# Patient Record
Sex: Female | Born: 1940 | Race: White | Hispanic: No | Smoking: Never smoker
Health system: Southern US, Community
[De-identification: ages and names within clinical notes are randomized; demographics above are authoritative.]

## PROBLEM LIST (undated history)

## (undated) DIAGNOSIS — H269 Unspecified cataract: Secondary | ICD-10-CM

## (undated) DIAGNOSIS — M810 Age-related osteoporosis without current pathological fracture: Secondary | ICD-10-CM

## (undated) DIAGNOSIS — Z9889 Other specified postprocedural states: Secondary | ICD-10-CM

## (undated) DIAGNOSIS — R112 Nausea with vomiting, unspecified: Secondary | ICD-10-CM

## (undated) DIAGNOSIS — G43009 Migraine without aura, not intractable, without status migrainosus: Secondary | ICD-10-CM

## (undated) DIAGNOSIS — E785 Hyperlipidemia, unspecified: Secondary | ICD-10-CM

## (undated) DIAGNOSIS — I10 Essential (primary) hypertension: Secondary | ICD-10-CM

## (undated) DIAGNOSIS — H3530 Unspecified macular degeneration: Secondary | ICD-10-CM

## (undated) DIAGNOSIS — N189 Chronic kidney disease, unspecified: Secondary | ICD-10-CM

## (undated) HISTORY — DX: Migraine without aura, not intractable, without status migrainosus: G43.009

## (undated) HISTORY — DX: Essential (primary) hypertension: I10

## (undated) HISTORY — DX: Hyperlipidemia, unspecified: E78.5

## (undated) HISTORY — DX: Chronic kidney disease, unspecified: N18.9

## (undated) HISTORY — PX: CATARACT EXTRACTION, BILATERAL: SHX1313

## (undated) HISTORY — DX: Unspecified cataract: H26.9

## (undated) HISTORY — PX: COLONOSCOPY: SHX174

---

## 1961-10-09 HISTORY — PX: NEPHRECTOMY: SHX65

## 1973-10-09 HISTORY — PX: TUBAL LIGATION: SHX77

## 2000-07-12 ENCOUNTER — Other Ambulatory Visit: Admission: RE | Admit: 2000-07-12 | Discharge: 2000-07-12 | Payer: Self-pay | Admitting: Obstetrics and Gynecology

## 2001-07-18 ENCOUNTER — Other Ambulatory Visit: Admission: RE | Admit: 2001-07-18 | Discharge: 2001-07-18 | Payer: Self-pay | Admitting: Obstetrics and Gynecology

## 2003-01-31 ENCOUNTER — Encounter: Payer: Self-pay | Admitting: *Deleted

## 2003-01-31 ENCOUNTER — Emergency Department (HOSPITAL_COMMUNITY): Admission: EM | Admit: 2003-01-31 | Discharge: 2003-01-31 | Payer: Self-pay | Admitting: Emergency Medicine

## 2006-03-26 ENCOUNTER — Ambulatory Visit: Payer: Self-pay | Admitting: Internal Medicine

## 2006-04-06 ENCOUNTER — Ambulatory Visit: Payer: Self-pay | Admitting: Internal Medicine

## 2006-04-06 ENCOUNTER — Encounter: Payer: Self-pay | Admitting: Internal Medicine

## 2008-11-05 ENCOUNTER — Ambulatory Visit: Payer: Self-pay | Admitting: Surgery

## 2011-02-21 NOTE — Procedures (Signed)
DUPLEX DEEP VENOUS EXAM - LOWER EXTREMITY   INDICATION:  Right lower extremity pain.   HISTORY:  Edema:  No.  Trauma/Surgery:  No.  Pain:  Right lower extremity popliteal fossa/calf, started Tuesday after  workout at gym.  PE:  No.  Previous DVT:  Left calf SVT approximately 7 years ago.  Anticoagulants:  Baby aspirin.  Other:   DUPLEX EXAM:                CFV   SFV   PopV  PTV    GSV                R  L  R  L  R  L  R   L  R  L  Thrombosis    o  o  o     o     o      o  Spontaneous   +  +  +     +     +      +  Phasic        +  +  +     +     +      +  Augmentation  +  +  +     +     +      +  Compressible  +  +  +     +     +      +  Competent   Legend:  + - yes  o - no  p - partial  D - decreased   IMPRESSION:  No evidence of DVT or SVT in the right lower extremity or  the left common femoral vein.    _____________________________  V. Charlena Cross, MD   AS/MEDQ  D:  11/05/2008  T:  11/05/2008  Job:  [id number]

## 2011-05-13 ENCOUNTER — Encounter: Payer: Self-pay | Admitting: Internal Medicine

## 2011-06-20 ENCOUNTER — Encounter: Payer: Self-pay | Admitting: *Deleted

## 2011-07-24 ENCOUNTER — Ambulatory Visit (AMBULATORY_SURGERY_CENTER): Payer: Federal, State, Local not specified - PPO

## 2011-07-24 ENCOUNTER — Encounter: Payer: Self-pay | Admitting: Internal Medicine

## 2011-07-24 VITALS — Ht 61.5 in | Wt 150.3 lb

## 2011-07-24 DIAGNOSIS — Z1211 Encounter for screening for malignant neoplasm of colon: Secondary | ICD-10-CM

## 2011-07-24 MED ORDER — PEG-KCL-NACL-NASULF-NA ASC-C 100 G PO SOLR: 1.0000 | Freq: Once | ORAL | Status: DC

## 2011-08-04 ENCOUNTER — Encounter: Payer: Self-pay | Admitting: Internal Medicine

## 2011-08-04 ENCOUNTER — Ambulatory Visit (AMBULATORY_SURGERY_CENTER): Payer: Federal, State, Local not specified - PPO | Admitting: Internal Medicine

## 2011-08-04 VITALS — BP 128/77 | HR 69 | Temp 97.6°F | Resp 19 | Ht 61.5 in | Wt 150.0 lb

## 2011-08-04 DIAGNOSIS — Z86010 Personal history of colonic polyps: Secondary | ICD-10-CM

## 2011-08-04 DIAGNOSIS — D126 Benign neoplasm of colon, unspecified: Secondary | ICD-10-CM

## 2011-08-04 DIAGNOSIS — Z1211 Encounter for screening for malignant neoplasm of colon: Secondary | ICD-10-CM

## 2011-08-04 MED ORDER — SODIUM CHLORIDE 0.9 % IV SOLN: 500.0000 mL | INTRAVENOUS | Status: DC

## 2011-08-04 NOTE — Patient Instructions (Signed)
Please read the handouts given to you by your recovery room nurse about polyps.   Resume your routine medications today.  Call us if you have any questions at [phone].  Thank-you for choosing [facility] for your medical needs today.

## 2011-08-07 ENCOUNTER — Telehealth: Payer: Self-pay | Admitting: *Deleted

## 2011-08-07 NOTE — Telephone Encounter (Signed)
Left message

## 2014-05-11 ENCOUNTER — Ambulatory Visit (HOSPITAL_COMMUNITY)
Admission: RE | Admit: 2014-05-11 | Discharge: 2014-05-11 | Disposition: A | Discharge status: Home / Self Care | Payer: Federal, State, Local not specified - PPO | Source: Ambulatory Visit | Attending: Internal Medicine | Admitting: Internal Medicine

## 2014-05-11 ENCOUNTER — Other Ambulatory Visit (HOSPITAL_COMMUNITY): Payer: Self-pay | Admitting: Internal Medicine

## 2014-05-11 ENCOUNTER — Encounter (HOSPITAL_COMMUNITY): Payer: Self-pay

## 2014-05-11 DIAGNOSIS — M810 Age-related osteoporosis without current pathological fracture: Secondary | ICD-10-CM | POA: Insufficient documentation

## 2014-05-11 HISTORY — DX: Age-related osteoporosis without current pathological fracture: M81.0

## 2014-05-11 MED ORDER — ZOLEDRONIC ACID 5 MG/100ML IV SOLN
5.0000 mg | Freq: Once | INTRAVENOUS | Status: AC
  Administered 2014-05-11: 5 mg via INTRAVENOUS
  Filled 2014-05-11: qty 100

## 2014-05-11 MED ORDER — SODIUM CHLORIDE 0.9 % IV SOLN
INTRAVENOUS | Status: DC
  Administered 2014-05-11: 250 mL via INTRAVENOUS

## 2014-05-11 NOTE — Discharge Instructions (Signed)
RECLAST °Zoledronic Acid injection (Paget's Disease, Osteoporosis) °What is this medicine? °ZOLEDRONIC ACID (ZOE le dron ik AS id) lowers the amount of calcium loss from bone. It is used to treat Paget's disease and osteoporosis in women. °This medicine may be used for other purposes; ask your health care provider or pharmacist if you have questions. °COMMON BRAND NAME(S): Reclast, Zometa °What should I tell my health care provider before I take this medicine? °They need to know if you have any of these conditions: °-aspirin-sensitive asthma °-cancer, especially if you are receiving medicines used to treat cancer °-dental disease or wear dentures °-infection °-kidney disease °-low levels of calcium in the blood °-past surgery on the parathyroid gland or intestines °-receiving corticosteroids like dexamethasone or prednisone °-an unusual or allergic reaction to zoledronic acid, other medicines, foods, dyes, or preservatives °-pregnant or trying to get pregnant °-breast-feeding °How should I use this medicine? °This medicine is for infusion into a vein. It is given by a health care professional in a hospital or clinic setting. °Talk to your pediatrician regarding the use of this medicine in children. This medicine is not approved for use in children. °Overdosage: If you think you have taken too much of this medicine contact a poison control center or emergency room at once. °NOTE: This medicine is only for you. Do not share this medicine with others. °What if I miss a dose? °It is important not to miss your dose. Call your doctor or health care professional if you are unable to keep an appointment. °What may interact with this medicine? °-certain antibiotics given by injection °-NSAIDs, medicines for pain and inflammation, like ibuprofen or naproxen °-some diuretics like bumetanide, furosemide °-teriparatide °This list may not describe all possible interactions. Give your health care provider a list of all the  medicines, herbs, non-prescription drugs, or dietary supplements you use. Also tell them if you smoke, drink alcohol, or use illegal drugs. Some items may interact with your medicine. °What should I watch for while using this medicine? °Visit your doctor or health care professional for regular checkups. It may be some time before you see the benefit from this medicine. Do not stop taking your medicine unless your doctor tells you to. Your doctor may order blood tests or other tests to see how you are doing. °Women should inform their doctor if they wish to become pregnant or think they might be pregnant. There is a potential for serious side effects to an unborn child. Talk to your health care professional or pharmacist for more information. °You should make sure that you get enough calcium and vitamin D while you are taking this medicine. Discuss the foods you eat and the vitamins you take with your health care professional. °Some people who take this medicine have severe bone, joint, and/or muscle pain. This medicine may also increase your risk for jaw problems or a broken thigh bone. Tell your doctor right away if you have severe pain in your jaw, bones, joints, or muscles. Tell your doctor if you have any pain that does not go away or that gets worse. °Tell your dentist and dental surgeon that you are taking this medicine. You should not have major dental surgery while on this medicine. See your dentist to have a dental exam and fix any dental problems before starting this medicine. Take good care of your teeth while on this medicine. Make sure you see your dentist for regular follow-up appointments. °What side effects may I notice from receiving this   medicine? °Side effects that you should report to your doctor or health care professional as soon as possible: °-allergic reactions like skin rash, itching or hives, swelling of the face, lips, or tongue °-anxiety, confusion, or depression °-breathing  problems °-changes in vision °-eye pain °-feeling faint or lightheaded, falls °-jaw pain, especially after dental work °-mouth sores °-muscle cramps, stiffness, or weakness °-trouble passing urine or change in the amount of urine °Side effects that usually do not require medical attention (report to your doctor or health care professional if they continue or are bothersome): °-bone, joint, or muscle pain °-constipation °-diarrhea °-fever °-hair loss °-irritation at site where injected °-loss of appetite °-nausea, vomiting °-stomach upset °-trouble sleeping °-trouble swallowing °-weak or tired °This list may not describe all possible side effects. Call your doctor for medical advice about side effects. You may report side effects to FDA at 1-800-FDA-1088. °Where should I keep my medicine? °This drug is given in a hospital or clinic and will not be stored at home. °NOTE: This sheet is a summary. It may not cover all possible information. If you have questions about this medicine, talk to your doctor, pharmacist, or health care provider. °© 2015, [employer]. (2013-03-10 10:03:48) °Osteoporosis °Throughout your life, your body breaks down old bone and replaces it with new bone. As you get older, your body does not replace bone as quickly as it breaks it down. By the age of 30 years, most people begin to gradually lose bone because of the imbalance between bone loss and replacement. Some people lose more bone than others. Bone loss beyond a specified normal degree is considered osteoporosis.  °Osteoporosis affects the strength and durability of your bones. The inside of the ends of your bones and your flat bones, like the bones of your pelvis, look like honeycomb, filled with tiny open spaces. As bone loss occurs, your bones become less dense. This means that the open spaces inside your bones become bigger and the walls between these spaces become thinner. This makes your bones weaker. Bones of a person with  osteoporosis can become so weak that they can break (fracture) during minor accidents, such as a simple fall. °CAUSES  °The following factors have been associated with the development of osteoporosis: °· Smoking. °· Drinking more than 2 alcoholic drinks several days per week. °· Long-term use of certain medicines: °¨ Corticosteroids. °¨ Chemotherapy medicines. °¨ Thyroid medicines. °¨ Antiepileptic medicines. °¨ Gonadal hormone suppression medicine. °¨ Immunosuppression medicine. °· Being underweight. °· Lack of physical activity. °· Lack of exposure to the sun. This can lead to vitamin D deficiency. °· Certain medical conditions: °¨ Certain inflammatory bowel diseases, such as Crohn disease and ulcerative colitis. °¨ Diabetes. °¨ Hyperthyroidism. °¨ Hyperparathyroidism. °RISK FACTORS °Anyone can develop osteoporosis. However, the following factors can increase your risk of developing osteoporosis: °· Gender--Women are at higher risk than men. °· Age--Being older than 50 years increases your risk. °· Ethnicity--White and Asian people have an increased risk. °· Weight --Being extremely underweight can increase your risk of osteoporosis. °· Family history of osteoporosis--Having a family member who has developed osteoporosis can increase your risk. °SYMPTOMS  °Usually, people with osteoporosis have no symptoms.  °DIAGNOSIS  °Signs during a physical exam that may prompt your caregiver to suspect osteoporosis include: °· Decreased height. This is usually caused by the compression of the bones that form your spine (vertebrae) because they have weakened and become fractured. °· A curving or rounding of the upper back (kyphosis). °  To confirm signs of osteoporosis, your caregiver may request a procedure that uses 2 low-dose X-ray beams with different levels of energy to measure your bone mineral density (dual-energy X-ray absorptiometry [DXA]). Also, your caregiver may check your level of vitamin D. °TREATMENT  °The goal of  osteoporosis treatment is to strengthen bones in order to decrease the risk of bone fractures. There are different types of medicines available to help achieve this goal. Some of these medicines work by slowing the processes of bone loss. Some medicines work by increasing bone density. Treatment also involves making sure that your levels of calcium and vitamin D are adequate. °PREVENTION  °There are things you can do to help prevent osteoporosis. Adequate intake of calcium and vitamin D can help you achieve optimal bone mineral density. Regular exercise can also help, especially resistance and weight-bearing activities. If you smoke, quitting smoking is an important part of osteoporosis prevention. °MAKE SURE YOU: °· Understand these instructions. °· Will watch your condition. °· Will get help right away if you are not doing well or get worse. °FOR MORE INFORMATION °www.osteo.org and www.nof.org °Document Released: 07/05/2005 Document Revised: 01/20/2013 Document Reviewed: 09/09/2011 °ExitCare® Patient Information ©2015 [employer]. This information is not intended to replace advice given to you by your health care provider. Make sure you discuss any questions you have with your health care provider. ° ° °

## 2015-04-28 ENCOUNTER — Encounter: Payer: Self-pay | Admitting: Internal Medicine

## 2015-05-18 ENCOUNTER — Ambulatory Visit (HOSPITAL_COMMUNITY)
Admission: RE | Admit: 2015-05-18 | Discharge: 2015-05-18 | Disposition: A | Discharge status: Home / Self Care | Payer: Federal, State, Local not specified - PPO | Source: Ambulatory Visit | Attending: Internal Medicine | Admitting: Internal Medicine

## 2015-05-18 ENCOUNTER — Encounter (HOSPITAL_COMMUNITY): Payer: Self-pay

## 2015-05-18 ENCOUNTER — Encounter (INDEPENDENT_AMBULATORY_CARE_PROVIDER_SITE_OTHER): Payer: Self-pay

## 2015-05-18 DIAGNOSIS — M810 Age-related osteoporosis without current pathological fracture: Secondary | ICD-10-CM | POA: Diagnosis present

## 2015-05-18 HISTORY — DX: Unspecified macular degeneration: H35.30

## 2015-05-18 MED ORDER — ZOLEDRONIC ACID 5 MG/100ML IV SOLN
5.0000 mg | Freq: Once | INTRAVENOUS | Status: AC
  Administered 2015-05-18: 5 mg via INTRAVENOUS
  Filled 2015-05-18: qty 100

## 2015-05-18 MED ORDER — SODIUM CHLORIDE 0.9 % IV SOLN
INTRAVENOUS | Status: DC
  Administered 2015-05-18: 250 mL via INTRAVENOUS

## 2015-05-18 NOTE — Discharge Instructions (Signed)

## 2015-05-18 NOTE — Progress Notes (Signed)
Uneventful infusion of Reclast today.  Pt tolerated well. D/c instructions given on reclast and explained.  Pt d/c ambulatory to lobby.

## 2015-09-01 ENCOUNTER — Observation Stay (HOSPITAL_COMMUNITY)
Admission: EM | Admit: 2015-09-01 | Discharge: 2015-09-02 | Disposition: A | Discharge status: Home / Self Care | Payer: Federal, State, Local not specified - PPO | Attending: Internal Medicine | Admitting: Internal Medicine

## 2015-09-01 ENCOUNTER — Observation Stay (HOSPITAL_COMMUNITY): Payer: Federal, State, Local not specified - PPO

## 2015-09-01 ENCOUNTER — Encounter (HOSPITAL_COMMUNITY): Payer: Self-pay

## 2015-09-01 ENCOUNTER — Observation Stay (HOSPITAL_BASED_OUTPATIENT_CLINIC_OR_DEPARTMENT_OTHER): Payer: Federal, State, Local not specified - PPO

## 2015-09-01 ENCOUNTER — Emergency Department (HOSPITAL_COMMUNITY): Payer: Federal, State, Local not specified - PPO

## 2015-09-01 DIAGNOSIS — G43109 Migraine with aura, not intractable, without status migrainosus: Secondary | ICD-10-CM | POA: Insufficient documentation

## 2015-09-01 DIAGNOSIS — G458 Other transient cerebral ischemic attacks and related syndromes: Secondary | ICD-10-CM

## 2015-09-01 DIAGNOSIS — H3530 Unspecified macular degeneration: Secondary | ICD-10-CM | POA: Diagnosis not present

## 2015-09-01 DIAGNOSIS — E785 Hyperlipidemia, unspecified: Secondary | ICD-10-CM | POA: Insufficient documentation

## 2015-09-01 DIAGNOSIS — E876 Hypokalemia: Secondary | ICD-10-CM | POA: Diagnosis not present

## 2015-09-01 DIAGNOSIS — G459 Transient cerebral ischemic attack, unspecified: Secondary | ICD-10-CM

## 2015-09-01 DIAGNOSIS — I129 Hypertensive chronic kidney disease with stage 1 through stage 4 chronic kidney disease, or unspecified chronic kidney disease: Secondary | ICD-10-CM | POA: Insufficient documentation

## 2015-09-01 DIAGNOSIS — Z79899 Other long term (current) drug therapy: Secondary | ICD-10-CM | POA: Insufficient documentation

## 2015-09-01 DIAGNOSIS — I10 Essential (primary) hypertension: Secondary | ICD-10-CM | POA: Diagnosis not present

## 2015-09-01 DIAGNOSIS — Z7982 Long term (current) use of aspirin: Secondary | ICD-10-CM | POA: Diagnosis not present

## 2015-09-01 DIAGNOSIS — R42 Dizziness and giddiness: Secondary | ICD-10-CM | POA: Diagnosis present

## 2015-09-01 DIAGNOSIS — N189 Chronic kidney disease, unspecified: Secondary | ICD-10-CM | POA: Diagnosis not present

## 2015-09-01 LAB — BASIC METABOLIC PANEL
Anion gap: 8 (ref 5–15)
BUN: 15 mg/dL (ref 6–20)
CALCIUM: 9.4 mg/dL (ref 8.9–10.3)
CHLORIDE: 107 mmol/L (ref 101–111)
CO2: 25 mmol/L (ref 22–32)
CREATININE: 0.92 mg/dL (ref 0.44–1.00)
GFR calc non Af Amer: 60 mL/min — ABNORMAL LOW (ref >60)
GLUCOSE: 106 mg/dL — AB (ref 65–99)
Potassium: 3.8 mmol/L (ref 3.5–5.1)
Sodium: 140 mmol/L (ref 135–145)

## 2015-09-01 LAB — CBC WITH DIFFERENTIAL/PLATELET
BASOS ABS: 0 10*3/uL (ref 0.0–0.1)
BASOS PCT: 0 %
EOS ABS: 0.3 10*3/uL (ref 0.0–0.7)
EOS PCT: 5 %
HCT: 37.5 % (ref 36.0–46.0)
Hemoglobin: 12.3 g/dL (ref 12.0–15.0)
Lymphocytes Relative: 38 %
Lymphs Abs: 2.3 10*3/uL (ref 0.7–4.0)
MCH: 27.7 pg (ref 26.0–34.0)
MCHC: 32.8 g/dL (ref 30.0–36.0)
MCV: 84.5 fL (ref 78.0–100.0)
MONO ABS: 0.7 10*3/uL (ref 0.1–1.0)
Monocytes Relative: 12 %
Neutro Abs: 2.7 10*3/uL (ref 1.7–7.7)
Neutrophils Relative %: 45 %
PLATELETS: 231 10*3/uL (ref 150–400)
RBC: 4.44 MIL/uL (ref 3.87–5.11)
RDW: 15.6 % — AB (ref 11.5–15.5)
WBC: 6.1 10*3/uL (ref 4.0–10.5)

## 2015-09-01 LAB — I-STAT CHEM 8, ED
BUN: 24 mg/dL — ABNORMAL HIGH (ref 6–20)
CHLORIDE: 99 mmol/L — AB (ref 101–111)
Calcium, Ion: 1.22 mmol/L (ref 1.13–1.30)
Creatinine, Ser: 1 mg/dL (ref 0.44–1.00)
Glucose, Bld: 99 mg/dL (ref 65–99)
HEMATOCRIT: 40 % (ref 36.0–46.0)
HEMOGLOBIN: 13.6 g/dL (ref 12.0–15.0)
POTASSIUM: 2.6 mmol/L — AB (ref 3.5–5.1)
SODIUM: 139 mmol/L (ref 135–145)
TCO2: 25 mmol/L (ref 0–100)

## 2015-09-01 LAB — TSH: TSH: 3.519 u[IU]/mL (ref 0.350–4.500)

## 2015-09-01 LAB — URINE MICROSCOPIC-ADD ON

## 2015-09-01 LAB — LIPID PANEL
CHOLESTEROL: 190 mg/dL (ref 0–200)
HDL: 70 mg/dL (ref >40)
LDL Cholesterol: 104 mg/dL — ABNORMAL HIGH (ref 0–99)
TRIGLYCERIDES: 82 mg/dL (ref <150)
Total CHOL/HDL Ratio: 2.7 RATIO
VLDL: 16 mg/dL (ref 0–40)

## 2015-09-01 LAB — URINALYSIS, ROUTINE W REFLEX MICROSCOPIC
Bilirubin Urine: NEGATIVE
Glucose, UA: NEGATIVE mg/dL
KETONES UR: NEGATIVE mg/dL
NITRITE: NEGATIVE
Protein, ur: NEGATIVE mg/dL
Specific Gravity, Urine: 1.007 (ref 1.005–1.030)
pH: 6.5 (ref 5.0–8.0)

## 2015-09-01 LAB — MAGNESIUM: Magnesium: 1.9 mg/dL (ref 1.7–2.4)

## 2015-09-01 MED ORDER — OMEGA-3-ACID ETHYL ESTERS 1 G PO CAPS
1.0000 g | ORAL_CAPSULE | Freq: Every day | ORAL | Status: DC
  Administered 2015-09-01 – 2015-09-02 (×2): 1 g via ORAL
  Filled 2015-09-01 (×2): qty 1

## 2015-09-01 MED ORDER — CALCIUM CARBONATE-VITAMIN D 500-200 MG-UNIT PO TABS
1.0000 | ORAL_TABLET | Freq: Two times a day (BID) | ORAL | Status: DC
  Administered 2015-09-01 – 2015-09-02 (×3): 1 via ORAL
  Filled 2015-09-01 (×3): qty 1

## 2015-09-01 MED ORDER — TURMERIC CURCUMIN PO CAPS: 1.0000 | ORAL_CAPSULE | Freq: Two times a day (BID) | ORAL | Status: DC

## 2015-09-01 MED ORDER — ENOXAPARIN SODIUM 40 MG/0.4ML ~~LOC~~ SOLN
40.0000 mg | SUBCUTANEOUS | Status: DC
  Administered 2015-09-01 – 2015-09-02 (×2): 40 mg via SUBCUTANEOUS
  Filled 2015-09-01 (×2): qty 0.4

## 2015-09-01 MED ORDER — POTASSIUM CHLORIDE 10 MEQ/100ML IV SOLN
10.0000 meq | INTRAVENOUS | Status: AC
  Administered 2015-09-01 (×4): 10 meq via INTRAVENOUS
  Filled 2015-09-01 (×4): qty 100

## 2015-09-01 MED ORDER — DOCUSATE SODIUM 100 MG PO CAPS
100.0000 mg | ORAL_CAPSULE | Freq: Every day | ORAL | Status: DC
  Administered 2015-09-01 – 2015-09-02 (×2): 100 mg via ORAL
  Filled 2015-09-01 (×2): qty 1

## 2015-09-01 MED ORDER — STROKE: EARLY STAGES OF RECOVERY BOOK
Freq: Once | Status: AC
  Administered 2015-09-01: 1

## 2015-09-01 MED ORDER — PROPRANOLOL HCL 20 MG PO TABS
20.0000 mg | ORAL_TABLET | Freq: Two times a day (BID) | ORAL | Status: DC
  Administered 2015-09-01 – 2015-09-02 (×3): 20 mg via ORAL
  Filled 2015-09-01 (×4): qty 1

## 2015-09-01 MED ORDER — ATORVASTATIN CALCIUM 40 MG PO TABS
40.0000 mg | ORAL_TABLET | Freq: Every day | ORAL | Status: DC
  Administered 2015-09-01: 40 mg via ORAL
  Filled 2015-09-01: qty 1

## 2015-09-01 MED ORDER — SENNOSIDES-DOCUSATE SODIUM 8.6-50 MG PO TABS: 1.0000 | ORAL_TABLET | Freq: Every evening | ORAL | Status: DC | PRN

## 2015-09-01 MED ORDER — SODIUM CHLORIDE 0.9 % IV SOLN: INTRAVENOUS | Status: DC

## 2015-09-01 MED ORDER — ASPIRIN EC 81 MG PO TBEC
81.0000 mg | DELAYED_RELEASE_TABLET | Freq: Every day | ORAL | Status: DC
  Administered 2015-09-01 – 2015-09-02 (×2): 81 mg via ORAL
  Filled 2015-09-01 (×2): qty 1

## 2015-09-01 MED ORDER — LYSINE 1000 MG PO TABS: 500.0000 mg | ORAL_TABLET | Freq: Two times a day (BID) | ORAL | Status: DC

## 2015-09-01 MED ORDER — CO Q 10 10 MG PO CAPS: 1.0000 | ORAL_CAPSULE | Freq: Every day | ORAL | Status: DC

## 2015-09-01 NOTE — Progress Notes (Signed)
[facility] PROGRESS NOTE    Progress Note   Sierra [name] [id number] DOB: [birth year] DOA: 09/01/2015 PCP: Geoffery Lyons, MD   Brief Narrative:   Sierra [name] is an 74 y.o. female comes in with a TIA  Assessment/Plan:  TIA (transient ischemic attack) HgbA1c pending, fasting lipid panel HDL > 70, LDL > 100, cont lipitor. MRI, MRA of the brain chronic microvascular ischemia CVA PT consult, OT consult, Speech consult pending Carotid dopplers pending Prophylactic therapy-Antiplatelet med: on aspirin at home, will differ to neuro. Avoid D5 fluids as may be harmfull risk factor modification  Cardiac Monitoring no events. Neurochecks q4h  Keep MAP 70 Nothing by mouth until she has a swallowing evaluation  Essential  HTN (hypertension) Continue hold antihypertensive medications, lower degree of permissiv-tension.  Hypokalemia Repleted and resolved.  Macular degeneration  Hyperlipidemia    DVT Prophylaxis - Lovenox ordered.  Family Communication:none Disposition Plan: Home when stable. Code Status:     Code Status Orders        Start     Ordered   09/01/15 0538  Full code   Continuous     09/01/15 0537        IV Access:    Peripheral IV   Procedures and diagnostic studies:   Ct Head Wo Contrast  09/01/2015  CLINICAL DATA:  Acute onset of difficulty completing sentences, with slurred speech and tongue tingling. Initial encounter. EXAM: CT HEAD WITHOUT CONTRAST TECHNIQUE: Contiguous axial images were obtained from the base of the skull through the vertex without intravenous contrast. COMPARISON:  None. FINDINGS: There is no evidence of acute infarction, mass lesion, or intra- or extra-axial hemorrhage on CT. Prominence of the sulci suggests mild cortical volume loss. The brainstem and fourth ventricle are within normal limits. The basal ganglia are unremarkable in appearance. The cerebral hemispheres demonstrate grossly normal gray-white  differentiation. No mass effect or midline shift is seen. There is no evidence of fracture; visualized osseous structures are unremarkable in appearance. The orbits are within normal limits. The paranasal sinuses and mastoid air cells are well-aerated. No significant soft tissue abnormalities are seen. IMPRESSION: 1. No acute intracranial pathology seen on CT. 2. Mild cortical volume loss noted. Electronically Signed   By: Garald Balding M.D.   On: 09/01/2015 02:32   Mr Brain Wo Contrast  09/01/2015  CLINICAL DATA:  Transient episode of speech difficulty. Numbness in her tongue. Dizziness EXAM: MRI HEAD WITHOUT CONTRAST MRA HEAD WITHOUT CONTRAST TECHNIQUE: Multiplanar, multiecho pulse sequences of the brain and surrounding structures were obtained without intravenous contrast. Angiographic images of the head were obtained using MRA technique without contrast. COMPARISON:  CT head without contrast from the same day. FINDINGS: MRI HEAD FINDINGS The diffusion-weighted images demonstrate no evidence for acute or subacute infarction. No acute hemorrhage or mass lesion is present. Scattered subcortical T2 hyperintensities are evident bilaterally. These are slightly greater than expected for age. Minimal periventricular T2 changes are present. The brainstem is within normal limits. The cerebellum is unremarkable. The internal auditory canals are within normal limits. Flow is present in the major intracranial arteries. The globes and orbits are intact. The skullbase is within normal limits. Midline structures are unremarkable. MRA HEAD FINDINGS There is some tortuosity of the right internal carotid artery without stenosis. The internal carotid arteries are otherwise within normal limits to the ICA termini bilaterally. The A1 and M1 segments are normal. A posterior communicating artery is present on the right. The MCA bifurcations are intact. [place]  and MCA branch vessels are unremarkable. The vertebral arteries are  codominant. The PICA origins are visualized and normal. AICA vessels are seen bilaterally. The right posterior cerebral artery is of fetal type. PCA branch vessels are within normal limits. IMPRESSION: 1. No evidence for acute or subacute infarction. 2. Scattered subcortical T2 hyperintensities bilaterally are greater than expected for age and likely reflect the sequela of chronic microvascular ischemia. 3. Normal variant MRA circle of Willis without evidence for significant proximal stenosis, aneurysm, or branch vessel occlusion. Electronically Signed   By: San Morelle M.D.   On: 09/01/2015 11:44   Mr Jodene Nam Head/brain Wo Cm  09/01/2015  CLINICAL DATA:  Transient episode of speech difficulty. Numbness in her tongue. Dizziness EXAM: MRI HEAD WITHOUT CONTRAST MRA HEAD WITHOUT CONTRAST TECHNIQUE: Multiplanar, multiecho pulse sequences of the brain and surrounding structures were obtained without intravenous contrast. Angiographic images of the head were obtained using MRA technique without contrast. COMPARISON:  CT head without contrast from the same day. FINDINGS: MRI HEAD FINDINGS The diffusion-weighted images demonstrate no evidence for acute or subacute infarction. No acute hemorrhage or mass lesion is present. Scattered subcortical T2 hyperintensities are evident bilaterally. These are slightly greater than expected for age. Minimal periventricular T2 changes are present. The brainstem is within normal limits. The cerebellum is unremarkable. The internal auditory canals are within normal limits. Flow is present in the major intracranial arteries. The globes and orbits are intact. The skullbase is within normal limits. Midline structures are unremarkable. MRA HEAD FINDINGS There is some tortuosity of the right internal carotid artery without stenosis. The internal carotid arteries are otherwise within normal limits to the ICA termini bilaterally. The A1 and M1 segments are normal. A posterior  communicating artery is present on the right. The MCA bifurcations are intact. ACA and MCA branch vessels are unremarkable. The vertebral arteries are codominant. The PICA origins are visualized and normal. AICA vessels are seen bilaterally. The right posterior cerebral artery is of fetal type. PCA branch vessels are within normal limits. IMPRESSION: 1. No evidence for acute or subacute infarction. 2. Scattered subcortical T2 hyperintensities bilaterally are greater than expected for age and likely reflect the sequela of chronic microvascular ischemia. 3. Normal variant MRA circle of Willis without evidence for significant proximal stenosis, aneurysm, or branch vessel occlusion. Electronically Signed   By: San Morelle M.D.   On: 09/01/2015 11:44     Medical Consultants:    None.  Anti-Infectives:   Anti-infectives    None      Subjective:    Sierra [name] no complains want to go home.  Objective:    Filed Vitals:   09/01/15 0600 09/01/15 0756 09/01/15 0957 09/01/15 1200  BP: 140/63 131/70 134/72 138/72  Pulse: 75 69 71 70  Temp: 98.8 F (37.1 C) 98.4 F (36.9 C) 98.4 F (36.9 C) 98.4 F (36.9 C)  TempSrc: Oral Oral Oral Oral  Resp: 18 18 20 18   Height: 5\' 2"  (1.575 m)     Weight: 61.236 kg (135 lb)     SpO2: 98% 97% 98% 96%   No intake or output data in the 24 hours ending 09/01/15 1222 Filed Weights   09/01/15 0047 09/01/15 0600  Weight: 59.875 kg (132 lb) 61.236 kg (135 lb)    Exam: Gen:  NAD Cardiovascular:  RRR, No M/R/G Chest and lungs:   CTAB Abdomen:  Abdomen soft, NT/ND, + BS Extremities:  No C/E/C   Data Reviewed:    Labs:  Basic Metabolic Panel:  Recent Labs Lab 09/01/15 0113 09/01/15 0128 09/01/15 0710  NA  --  139 140  K  --  2.6* 3.8  CL  --  99* 107  CO2  --   --  25  GLUCOSE  --  99 106*  BUN  --  24* 15  CREATININE  --  1.00 0.92  CALCIUM  --   --  9.4  MG 1.9  --   --    GFR Estimated Creatinine Clearance: 46.2  mL/min (by C-G formula based on Cr of 0.92). Liver Function Tests: No results for input(s): AST, ALT, ALKPHOS, BILITOT, PROT, ALBUMIN in the last 168 hours. No results for input(s): LIPASE, AMYLASE in the last 168 hours. No results for input(s): AMMONIA in the last 168 hours. Coagulation profile No results for input(s): INR, PROTIME in the last 168 hours.  CBC:  Recent Labs Lab 09/01/15 0113 09/01/15 0128  WBC 6.1  --   NEUTROABS 2.7  --   HGB 12.3 13.6  HCT 37.5 40.0  MCV 84.5  --   PLT 231  --    Cardiac Enzymes: No results for input(s): CKTOTAL, CKMB, CKMBINDEX, TROPONINI in the last 168 hours. BNP (last 3 results) No results for input(s): PROBNP in the last 8760 hours. CBG: No results for input(s): GLUCAP in the last 168 hours. D-Dimer: No results for input(s): DDIMER in the last 72 hours. Hgb A1c: No results for input(s): HGBA1C in the last 72 hours. Lipid Profile:  Recent Labs  09/01/15 0710  CHOL 190  HDL 70  LDLCALC 104*  TRIG 82  CHOLHDL 2.7   Thyroid function studies:  Recent Labs  09/01/15 0710  TSH 3.519   Anemia work up: No results for input(s): VITAMINB12, FOLATE, FERRITIN, TIBC, IRON, RETICCTPCT in the last 72 hours. Sepsis Labs:  Recent Labs Lab 09/01/15 0113  WBC 6.1   Microbiology No results found for this or any previous visit (from the past 240 hour(s)).   Medications:   . aspirin EC  81 mg Oral Daily  . atorvastatin  40 mg Oral q1800  . calcium-vitamin D  1 tablet Oral BID  . docusate sodium  100 mg Oral Daily  . enoxaparin (LOVENOX) injection  40 mg Subcutaneous Q24H  . omega-3 acid ethyl esters  1 g Oral Daily  . propranolol  20 mg Oral BID   Continuous Infusions: . sodium chloride 75 mL/hr (09/01/15 0545)    Time spent: 15 min   LOS: 0 days   [name]  [facility] Pager [phone]  *Please refer to [email or web address], password [email or web address] to get updated schedule on who will round on this patient, as  hospitalists switch teams weekly. If 7PM-7AM, please contact night-coverage at [email or web address], password [email or web address] for any overnight needs.  09/01/2015, 12:22 PM

## 2015-09-01 NOTE — ED Notes (Signed)
[facility] here to transfer pt to [facility]. 

## 2015-09-01 NOTE — ED Notes (Signed)
Report given to [facility] via telephone 

## 2015-09-01 NOTE — Progress Notes (Signed)
SLP Cancellation Note  Patient Details Name: Sierra [name] MRN: [id number] DOB: 04/17/1941   Cancelled treatment:       Reason Eval/Treat Not Completed: SLP screened, no needs identified, will sign off   [name] 09/01/2015, 11:41 AM

## 2015-09-01 NOTE — Evaluation (Signed)
Physical Therapy Evaluation and Discharge Patient Details Name: Sierra [name] MRN: [id number] DOB: [birth year] Today's Date: 09/01/2015   History of Present Illness  Came to ED due to difficulty speaking and numbness/tingling of tongue  PMHX-osteoporosis, macular degeneration, Lt hip pain  Clinical Impression  Patient evaluated by Physical Therapy with no further acute PT needs identified. All education has been completed and the patient has no further questions. Pt scored 24/24 on Dynamic Gait Index. She is very active (works out several times per week and cares for one yr old grandchild). PT is signing off. Thank you for this referral.     Follow Up Recommendations No PT follow up    Equipment Recommendations  None recommended by PT    Recommendations for Other Services       Precautions / Restrictions Precautions Precautions: None      Mobility  Bed Mobility Overal bed mobility: Independent                Transfers Overall transfer level: Independent Equipment used: None                Ambulation/Gait Ambulation/Gait assistance: Independent Ambulation Distance (Feet): 300 Feet Assistive device: None Gait Pattern/deviations: WFL(Within Functional Limits) (slight trendelenberg due to hip pain (chronic))   Gait velocity interpretation: at or above normal speed for age/gender    Stairs Stairs: Yes          Wheelchair Mobility    Modified Rankin (Stroke Patients Only) Modified Rankin (Stroke Patients Only) Pre-Morbid Rankin Score: No symptoms Modified Rankin: No symptoms     Balance Overall balance assessment: Independent   Sitting balance-Leahy Scale: Normal       Standing balance-Leahy Scale: Normal Standing balance comment: able to stand on one foot and bring other foot up to adjust her sock                 Standardized Balance Assessment Standardized Balance Assessment : Dynamic Gait Index   Dynamic Gait Index Level  Surface: Normal Change in Gait Speed: Normal Gait with Horizontal Head Turns: Normal Gait with Vertical Head Turns: Normal Gait and Pivot Turn: Normal Step Over Obstacle: Normal Step Around Obstacles: Normal Steps: Normal Total Score: 24       Pertinent Vitals/Pain Pain Assessment: No/denies pain    Home Living Family/patient expects to be discharged to:: Private residence Living Arrangements: Children;Other relatives (grandson) Available Help at Discharge: Family;Available 24 hours/day Type of Home: House (condo) Home Access: Stairs to enter Entrance Stairs-Rails: None Technical brewer of Steps: 1 Home Layout: Two level;Bed/bath upstairs Home Equipment: Grab bars - tub/shower      Prior Function Level of Independence: Independent         Comments: works 2 mornings week; volunteers at [employer]   Dominant Hand: Left    Extremity/Trunk Assessment   Upper Extremity Assessment: Overall WFL for tasks assessed           Lower Extremity Assessment: Overall WFL for tasks assessed      Cervical / Trunk Assessment: Normal  Communication   Communication: No difficulties  Cognition Arousal/Alertness: Awake/alert Behavior During Therapy: WFL for tasks assessed/performed Overall Cognitive Status: Within Functional Limits for tasks assessed                      General Comments      Exercises        Assessment/Plan    PT Assessment Patent  does not need any further PT services  PT Diagnosis Difficulty walking   PT Problem List    PT Treatment Interventions     PT Goals (Current goals can be found in the Care Plan section) Acute Rehab PT Goals PT Goal Formulation: All assessment and education complete, DC therapy    Frequency     Barriers to discharge        Co-evaluation               End of Session Equipment Utilized During Treatment: Gait belt Activity Tolerance: Patient tolerated treatment well Patient  left: in chair;with call bell/phone within reach Nurse Communication: Mobility status (Independent without IV pole; D/C from PT)    Functional Assessment Tool Used: clinical judgement  Functional Limitation: Mobility: Walking and moving around Mobility: Walking and Moving Around Current Status [phone]): 0 percent impaired, limited or restricted Mobility: Walking and Moving Around Goal Status [phone]): 0 percent impaired, limited or restricted Mobility: Walking and Moving Around Discharge Status [phone]): 0 percent impaired, limited or restricted    Time: [id number] PT Time Calculation (min) (ACUTE ONLY): 15 min   Charges:   PT Evaluation $Initial PT Evaluation Tier I: 1 Procedure     PT G Codes:   PT G-Codes **NOT FOR INPATIENT CLASS** Functional Assessment Tool Used: clinical judgement  Functional Limitation: Mobility: Walking and moving around Mobility: Walking and Moving Around Current Status [id number]): 0 percent impaired, limited or restricted Mobility: Walking and Moving Around Goal Status [id number]): 0 percent impaired, limited or restricted Mobility: Walking and Moving Around Discharge Status [id number]): 0 percent impaired, limited or restricted    Tawanda Schall 09/01/2015, 12:04 PM Pager [phone]

## 2015-09-01 NOTE — ED Notes (Signed)
Bed: WA11 Expected date:  Expected time:  Means of arrival:  Comments: EMS 

## 2015-09-01 NOTE — Progress Notes (Signed)
  Echocardiogram 2D Echocardiogram has been performed.  Jennette Dubin 09/01/2015, 1:44 PM

## 2015-09-01 NOTE — Care Management Note (Signed)
Case Management Note  Patient Details  Name: Sierra [name] MRN: [id number] Date of Birth: [birth year]  Subjective/Objective:                    Action/Plan: Patient was admitted with dizziness, difficulty with word finding and numbness. Lives at home with family. Will follow for discharge needs pending PT/OT evals and physician orders.  Expected Discharge Date:                  Expected Discharge Plan:     In-House Referral:     Discharge planning Services     Post Acute Care Choice:    Choice offered to:     DME Arranged:    DME Agency:     HH Arranged:    HH Agency:     Status of Service:  In process, will continue to follow  Medicare Important Message Given:    Date Medicare IM Given:    Medicare IM give by:    Date Additional Medicare IM Given:    Additional Medicare Important Message give by:     If discussed at [place] of Stay Meetings, dates discussed:    Additional Comments:  Rolm Baptise, RN 09/01/2015, 11:19 AM

## 2015-09-01 NOTE — ED Provider Notes (Signed)
CSN: [id number]     Arrival date & time 09/01/15  0041 History   By signing my name below, I, Sierra [name], attest that this documentation has been prepared under the direction and in the presence of Sierra Wroblewski, MD.  Electronically Signed: Forrestine [name], ED Scribe. 09/01/2015. 1:51 AM.   Chief Complaint  Patient presents with  . Stroke Symptoms   The history is provided by the patient. No language interpreter was used.    HPI Comments: Sierra [name] is a 74 y.o. female with a PMHx of HTN and CKD who presents to the Emergency Department her for stroke like symptoms this evening. Pt reports new onset dizziness, mild numbness to the tip of her tongue, and trouble finishing her sentences onset 10:00 PM this evening. She also reports a few episodes of slurred speech over the last hour. Denies any hsitory of same. No aggravating or allevaiting factors at this time. She denies any fever, chills, trouble swallowing, nausea, vomiting, abdominal pain, chest pain, or shortness of breath. No new medications. No weakness or loss of sensation.  PCP: Sierra Lyons, MD    Past Medical History  Diagnosis Date  . Hypertension   . 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 Osteoporosis   . Macular degeneration     Eyelea injections every 16 weeks   Past Surgical History  Procedure Laterality Date  . Colonoscopy      screenin gonly benign polyps removed  . Nephrectomy  1963    left  . Tubal ligation  1975   Family History  Problem Relation Age of Onset  . Colon cancer Neg Hx   . Esophageal cancer Neg Hx   . Stomach cancer Neg Hx    Social History  Substance Use Topics  . Smoking status: Never Smoker   . Smokeless tobacco: None  . Alcohol Use: 0.6 oz/week    1 Glasses of wine per week   OB History    No data available     Review of Systems  Constitutional: Negative for fever and chills.  HENT: Negative for congestion.   Respiratory:  Negative for cough and shortness of breath.   Cardiovascular: Negative for chest pain.  Gastrointestinal: Negative for nausea, vomiting and abdominal pain.  Genitourinary: Negative for dysuria.  Musculoskeletal: Negative for back pain.  Skin: Negative for rash.  Neurological: Positive for dizziness, speech difficulty and numbness. Negative for facial asymmetry, weakness and headaches.  Psychiatric/Behavioral: Negative for confusion.  All other systems reviewed and are negative.     Allergies  Review of patient's allergies indicates no known allergies.  Home Medications   Prior to Admission medications   Medication Sig Start Date End Date Taking? Authorizing Provider  Aspirin (ASPIR-81 PO) Take 1 tablet by mouth daily.   Yes Historical Provider, MD  Aspirin-Acetaminophen-Caffeine (GOODY HEADACHE PO) Take 1 packet by mouth every 4 (four) hours as needed (headache).   Yes Historical Provider, MD  atorvastatin (LIPITOR) 40 MG tablet Take 40 mg by mouth daily at 6 PM.  07/01/11  Yes Historical Provider, MD  Calcium Carb-Cholecalciferol (CALCIUM + D3) 600-200 MG-UNIT TABS Take 1 tablet by mouth 2 (two) times daily.   Yes Historical Provider, MD  Coenzyme Q10 (CO Q 10 PO) Take 1 capsule by mouth daily.   Yes Historical Provider, MD  docusate sodium (COLACE) 100 MG capsule Take 100 mg by mouth daily.   Yes Historical Provider, MD  Lysine  1000 MG TABS Take 500 mg by mouth 2 (two) times daily.    Yes Historical Provider, MD  Misc Natural Products (TURMERIC CURCUMIN) CAPS Take 1 capsule by mouth 2 (two) times daily.   Yes Historical Provider, MD  Omega-3 Fatty Acids (FISH OIL) 1000 MG CAPS Take 1 capsule by mouth 2 (two) times daily.   Yes Historical Provider, MD  propranolol (INDERAL) 20 MG tablet Take 20 mg by mouth 2 (two) times daily.  07/03/11  Yes Historical Provider, MD  triamterene-hydrochlorothiazide (MAXZIDE-25) 37.5-25 MG per tablet Take 1 tablet by mouth at bedtime.  07/01/11  Yes  Historical Provider, MD   Triage Vitals: BP 170/91 mmHg  Pulse 80  Resp 16  Ht 5\' 2"  (1.575 m)  Wt 132 lb (59.875 kg)  BMI 24.14 kg/m2  SpO2 99%   Physical Exam  Constitutional: She is oriented to person, place, and time. She appears well-developed and well-nourished. No distress.  HENT:  Head: Normocephalic and atraumatic.  Mouth/Throat: Oropharynx is clear and moist. No oropharyngeal exudate.  Eyes: EOM are normal. Pupils are equal, round, and reactive to light.  Neck: Normal range of motion. Neck supple.  Cardiovascular: Normal rate, regular rhythm and normal heart sounds.   No murmur heard. Pulmonary/Chest: Effort normal and breath sounds normal. She has no wheezes. She has no rales.  Abdominal: Soft. She exhibits no distension and no mass. There is no tenderness. There is no rebound and no guarding.  Musculoskeletal: Normal range of motion.  Neurological: She is alert and oriented to person, place, and time. She has normal reflexes.  5/5 strength to all extremities  Good upper and lower DTRs Cranial nerves 2-12 intact.  Skin: Skin is warm and dry.  Psychiatric: She has a normal mood and affect. Judgment normal.  Nursing note and vitals reviewed.   ED Course  Procedures (including critical care time)  DIAGNOSTIC STUDIES: Oxygen Saturation is 99% on RA, Normal by my interpretation.    COORDINATION OF CARE: 1:19 AM- Will order CT head without contrast, CBC, i-stat chem 8, and urinalysis. Discussed treatment plan with pt at bedside and pt agreed to plan.     Labs Review Labs Reviewed - No data to display  Imaging Review No results found. I have personally reviewed and evaluated these images and lab results as part of my medical decision-making.   EKG Interpretation None      MDM   Final diagnoses:  None    Case d/w Dr. Leonel [name] if mag normal send to [facility] for [facility]IA work up  [place] is normal K riders started will admit to [facility] for [facility] work up  I personally  performed the services described in this documentation, which was scribed in my presence. The recorded information has been reviewed and is accurate.     Sierra Kells, MD 09/01/15 [phone]

## 2015-09-01 NOTE — ED Notes (Signed)
Patient reports increased discomfort at IV site, KVO rate increased on saline. Will con't to monitor.

## 2015-09-01 NOTE — ED Notes (Signed)
Patient transported to CT 

## 2015-09-01 NOTE — ED Notes (Signed)
Patient c/o discomfort at IV site from Potassium infusion. 52ml NS bag hung at [facility] for patient comfort, patient states this relieved the burning. Patient was encouraged to notify staff if she began to have any additional burning.

## 2015-09-01 NOTE — Progress Notes (Signed)
Pt arrived on unit via [place] [facility], no obvious distress, NIHSS-0 admission orders implemented, on call notified of arrival, Pt oriented to room and equipment

## 2015-09-01 NOTE — Consult Note (Signed)
Requesting Physician: Dr. Olevia Bowens    Chief Complaint: TIA  HPI:                                                                                                                                         Sierra [name] is an 74 y.o. female Patient reports yesterday night around 9:30 to 10 PM she was talking on the phone and initially noticed difficulty in getting her words out. Denies having any slurred speech or visual changes at the time. She reported associated symptoms of feeling dizzy or lightheaded. The symptoms resolved and she went upstairs however her daughter called her back downstairs around approximately 11 PM to look at something. When she came back downstairs and in trying to comment on what she was looking at she had difficulty with getting words out like the "B" in beautiful. Also at this time noticed that the tip of her tongue felt numb/tingly. In relating these symptoms to her daughter they became concerned for the possibility of stroke and became immediately to the emergency department. [facility] to [facility] evaluate.  She currently is asymptomatic.  She endorses she takes ASA 81 mg daily.   Date last known well: Date: 08/31/2015 Time last known well: Time: 23:00 tPA Given: No: symptoms resolved.  Modified Rankin: Rankin Score=0    Past Medical History  Diagnosis Date  . Hypertension   . 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 Osteoporosis   . Macular degeneration     Eyelea injections every 16 weeks    Past Surgical History  Procedure Laterality Date  . Colonoscopy      screenin gonly benign polyps removed  . Nephrectomy  1963    left  . Tubal ligation  1975    Family History  Problem Relation Age of Onset  . Colon cancer Neg Hx   . Esophageal cancer Neg Hx   . Stomach cancer Neg Hx    Social History:  reports that she has never smoked. She does not have any smokeless tobacco history on file. She  reports that she drinks about 0.6 oz of alcohol per week. She reports that she does not use illicit drugs.  Allergies: No Known Allergies  Medications:  Prior to Admission:  Prescriptions prior to admission  Medication Sig Dispense Refill Last Dose  . Aspirin (ASPIR-81 PO) Take 1 tablet by mouth daily.   08/31/2015 at Unknown time  . Aspirin-Acetaminophen-Caffeine (GOODY HEADACHE PO) Take 1 packet by mouth every 4 (four) hours as needed (headache).   08/31/2015 at Unknown time  . atorvastatin (LIPITOR) 40 MG tablet Take 40 mg by mouth daily at 6 PM.    08/31/2015 at Unknown time  . Calcium Carb-Cholecalciferol (CALCIUM + D3) 600-200 MG-UNIT TABS Take 1 tablet by mouth 2 (two) times daily.   08/31/2015 at Unknown time  . Coenzyme Q10 (CO Q 10 PO) Take 1 capsule by mouth daily.   08/31/2015 at Unknown time  . docusate sodium (COLACE) 100 MG capsule Take 100 mg by mouth daily.   08/31/2015 at Unknown time  . Lysine 1000 MG TABS Take 500 mg by mouth 2 (two) times daily.    08/31/2015 at Unknown time  . Misc Natural Products (TURMERIC CURCUMIN) CAPS Take 1 capsule by mouth 2 (two) times daily.   08/31/2015 at Unknown time  . Omega-3 Fatty Acids (FISH OIL) 1000 MG CAPS Take 1 capsule by mouth 2 (two) times daily.   08/31/2015 at Unknown time  . propranolol (INDERAL) 20 MG tablet Take 20 mg by mouth 2 (two) times daily.    08/31/2015 at 2430  . triamterene-hydrochlorothiazide (MAXZIDE-25) 37.5-25 MG per tablet Take 1 tablet by mouth at bedtime.    08/31/2015 at Unknown time   Scheduled: . aspirin EC  81 mg Oral Daily  . atorvastatin  40 mg Oral q1800  . calcium-vitamin D  1 tablet Oral BID  . docusate sodium  100 mg Oral Daily  . enoxaparin (LOVENOX) injection  40 mg Subcutaneous Q24H  . omega-3 acid ethyl esters  1 g Oral Daily  . propranolol  20 mg Oral BID    ROS:                                                                                                                                        History obtained from the patient  General ROS: negative for - chills, fatigue, fever, night sweats, weight gain or weight loss Psychological ROS: negative for - behavioral disorder, hallucinations, memory difficulties, mood swings or suicidal ideation Ophthalmic ROS: negative for - blurry vision, double vision, eye pain or loss of vision ENT ROS: negative for - epistaxis, nasal discharge, oral lesions, sore throat, tinnitus or vertigo Allergy and Immunology ROS: negative for - hives or itchy/watery eyes Hematological and Lymphatic ROS: negative for - bleeding problems, bruising or swollen lymph nodes Endocrine ROS: negative for - galactorrhea, hair pattern changes, polydipsia/polyuria or temperature intolerance Respiratory ROS: negative for - cough, hemoptysis, shortness of breath or wheezing Cardiovascular ROS: negative for - chest pain, dyspnea on exertion, edema or irregular heartbeat Gastrointestinal ROS: negative for - abdominal pain,  diarrhea, hematemesis, nausea/vomiting or stool incontinence Genito-Urinary ROS: negative for - dysuria, hematuria, incontinence or urinary frequency/urgency Musculoskeletal ROS: negative for - joint swelling or muscular weakness Neurological ROS: as noted in HPI Dermatological ROS: negative for rash and skin lesion changes  Neurologic Examination:                                                                                                      Blood pressure 131/70, pulse 69, temperature 98.4 F (36.9 C), temperature source Oral, resp. rate 18, height 5\' 2"  (1.575 m), weight 61.236 kg (135 lb), SpO2 97 %.  HEENT-  Normocephalic, no lesions, without obvious abnormality.  Normal external eye and conjunctiva.  Normal TM's bilaterally.  Normal auditory canals and external ears. Normal external nose, mucus membranes and septum.   Normal pharynx. Cardiovascular- S1, S2 normal, pulses palpable throughout   Lungs- chest clear, no wheezing, rales, normal symmetric air entry Abdomen- normal findings: bowel sounds normal Extremities- no edema Lymph-no adenopathy palpable Musculoskeletal-no joint tenderness, deformity or swelling Skin-warm and dry, no hyperpigmentation, vitiligo, or suspicious lesions  Neurological Examination Mental Status: Alert, oriented, thought content appropriate.  Speech fluent without evidence of aphasia.  Able to follow 3 step commands without difficulty. Cranial Nerves: II: Discs flat bilaterally; Visual fields grossly normal, pupils equal, round, reactive to light and accommodation III,IV, VI: ptosis not present, extra-ocular motions intact bilaterally V,VII: smile symmetric, facial light touch sensation normal bilaterally VIII: hearing normal bilaterally IX,X: uvula rises symmetrically XI: bilateral shoulder shrug XII: midline tongue extension Motor: Right : Upper extremity   5/5    Left:     Upper extremity   5/5  Lower extremity   5/5     Lower extremity   5/5 Tone and bulk:normal tone throughout; no atrophy noted Sensory: Pinprick and light touch intact throughout, bilaterally Deep Tendon Reflexes: 2+ and symmetric throughout Plantars: Right: downgoing   Left: downgoing Cerebellar: normal finger-to-nose, normal rapid alternating movements and normal heel-to-shin test Gait: not tesed       Lab Results: Basic Metabolic Panel:  Recent Labs Lab 09/01/15 0113 09/01/15 0128 09/01/15 0710  NA  --  139 140  K  --  2.6* 3.8  CL  --  99* 107  CO2  --   --  25  GLUCOSE  --  99 106*  BUN  --  24* 15  CREATININE  --  1.00 0.92  CALCIUM  --   --  9.4  MG 1.9  --   --     Liver Function Tests: No results for input(s): AST, ALT, ALKPHOS, BILITOT, PROT, ALBUMIN in the last 168 hours. No results for input(s): LIPASE, AMYLASE in the last 168 hours. No results for input(s):  AMMONIA in the last 168 hours.  CBC:  Recent Labs Lab 09/01/15 0113 09/01/15 0128  WBC 6.1  --   NEUTROABS 2.7  --   HGB 12.3 13.6  HCT 37.5 40.0  MCV 84.5  --   PLT 231  --     Cardiac Enzymes: No results for input(s):  CKTOTAL, CKMB, CKMBINDEX, TROPONINI in the last 168 hours.  Lipid Panel:  Recent Labs Lab 09/01/15 0710  CHOL 190  TRIG 82  HDL 70  CHOLHDL 2.7  VLDL 16  LDLCALC 104*    CBG: No results for input(s): GLUCAP in the last 168 hours.  Microbiology: No results found for this or any previous visit.  Coagulation Studies: No results for input(s): LABPROT, INR in the last 72 hours.  Imaging: Ct Head Wo Contrast  09/01/2015  CLINICAL DATA:  Acute onset of difficulty completing sentences, with slurred speech and tongue tingling. Initial encounter. EXAM: CT HEAD WITHOUT CONTRAST TECHNIQUE: Contiguous axial images were obtained from the base of the skull through the vertex without intravenous contrast. COMPARISON:  None. FINDINGS: There is no evidence of acute infarction, mass lesion, or intra- or extra-axial hemorrhage on CT. Prominence of the sulci suggests mild cortical volume loss. The brainstem and fourth ventricle are within normal limits. The basal ganglia are unremarkable in appearance. The cerebral hemispheres demonstrate grossly normal gray-white differentiation. No mass effect or midline shift is seen. There is no evidence of fracture; visualized osseous structures are unremarkable in appearance. The orbits are within normal limits. The paranasal sinuses and mastoid air cells are well-aerated. No significant soft tissue abnormalities are seen. IMPRESSION: 1. No acute intracranial pathology seen on CT. 2. Mild cortical volume loss noted. Electronically Signed   By: Garald Balding M.D.   On: 09/01/2015 02:32     Etta Quill PA-C [facility] [id number]  09/01/2015, 9:44 AM   Assessment: 74 y.o. female with transient expressive aphasia and  dysarthria. Currently;y resolved. With her risk factors and age cannot rule out TIA/CVA.   Stroke Risk Factors - hypertension, hyperlipidemia  Recommend: 1. HgbA1c, fasting lipid panel 2. MRI, MRA  of the brain without contrast 3. PT consult, OT consult, Speech consult 4. Echocardiogram 5. Carotid dopplers 6. Prophylactic therapy-Antiplatelet med: Aspirin - dose 81 mg daily 7. Risk factor modification 8. Telemetry monitoring 9. Frequent neuro checks 10 NPO until passes stroke swallow screen 11 [place] [phone] starting 09/02/2015   I personally participated in this patient's evaluation and management, including formulating above clinical impression and management recommendations.  Rush Farmer M.D. [facility] [phone]

## 2015-09-01 NOTE — ED Notes (Signed)
[facility] was notified of pt's transfer to [facility]. 

## 2015-09-01 NOTE — H&P (Signed)
[facility] History and Physical  Sierra [name] [id number] DOB: Aug 25, 1941 DOA: 09/01/2015  Referring physician: ED PCP: Sierra Lyons, MD   Chief Complaint: Felt like I was having a stroke  HPI:  Sierra [name] is a 74 year old female with a past medical history significant for hypertension, macular degeneration, hyperlipidemia, osteoporosis, and chronic kidney disease; who presents after having strokelike symptoms yesterday night. Patient reports yesterday night around 9:30 to 10 PM she was talking on the phone and initially noticed difficulty in getting her words out. Denies having any slurred speech or visual changes at the time. She reported associated symptoms of feeling dizzy or lightheaded. The symptoms resolved and she went upstairs however her daughter called her back downstairs around approximately 11 PM to look at something. When she came back downstairs and in trying to comment on what she was looking at she had difficulty with getting words out like the "B" in beautiful. Also at this time noticed that the tip of her tongue felt numb/tingly. In relating these symptoms to her daughter they became concerned for the possibility of stroke and became immediately to the emergency department. Patient denied any focal weakness symptoms, facial droop, shortness of breath, palpitation, or chest pain. Patient reports that symptoms lasted approximately 15 minutes or so and then self resolved.  Upon arrival at [facility] emergency department the patient was evaluated with lab work that revealed significant low potassium of 2.6, and CT scan showed no acute abnormalities. The ED physician discussed patient's case with neurology Dr. Katherine [name] who recommended transfer to [facility] for further workup.     Review of Systems  Constitutional: Negative for chills and malaise/fatigue.  HENT: Positive for tinnitus. Negative for ear pain.   Eyes: Negative for double vision and photophobia.   Respiratory: Negative for hemoptysis, sputum production and shortness of breath.   Cardiovascular: Negative for chest pain and palpitations.  Gastrointestinal: Negative for nausea, vomiting and abdominal pain.  Genitourinary: Negative for urgency and frequency.  Musculoskeletal: Negative for joint pain and falls.  Skin: Negative for itching and rash.  Neurological: Positive for dizziness, tingling, sensory change and speech change. Negative for focal weakness.  Endo/Heme/Allergies: Negative for polydipsia. Does not bruise/bleed easily.  Psychiatric/Behavioral: Negative for suicidal ideas and substance abuse.      Past Medical History  Diagnosis Date  . Hypertension   . 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 Osteoporosis   . Macular degeneration     Eyelea injections every 16 weeks     Past Surgical History  Procedure Laterality Date  . Colonoscopy      screenin gonly benign polyps removed  . Nephrectomy  1963    left  . Tubal ligation  1975      Social History:  reports that she has never smoked. She does not have any smokeless tobacco history on file. She reports that she drinks about 0.6 oz of alcohol per week. She reports that she does not use illicit drugs. Where does patient live--home Can patient participate in ADLs? Yes  No Known Allergies  Family History  Problem Relation Age of Onset  . Colon cancer Neg Hx   . Esophageal cancer Neg Hx   . Stomach cancer Neg Hx         Prior to Admission medications   Medication Sig Start Date End Date Taking? Authorizing Provider  Aspirin (ASPIR-81 PO) Take 1 tablet by mouth daily.   Yes  Historical Provider, MD  Aspirin-Acetaminophen-Caffeine (GOODY HEADACHE PO) Take 1 packet by mouth every 4 (four) hours as needed (headache).   Yes Historical Provider, MD  atorvastatin (LIPITOR) 40 MG tablet Take 40 mg by mouth daily at 6 PM.  07/01/11  Yes Historical Provider, MD  Calcium  Carb-Cholecalciferol (CALCIUM + D3) 600-200 MG-UNIT TABS Take 1 tablet by mouth 2 (two) times daily.   Yes Historical Provider, MD  Coenzyme Q10 (CO Q 10 PO) Take 1 capsule by mouth daily.   Yes Historical Provider, MD  docusate sodium (COLACE) 100 MG capsule Take 100 mg by mouth daily.   Yes Historical Provider, MD  Lysine 1000 MG TABS Take 500 mg by mouth 2 (two) times daily.    Yes Historical Provider, MD  Misc Natural Products (TURMERIC CURCUMIN) CAPS Take 1 capsule by mouth 2 (two) times daily.   Yes Historical Provider, MD  Omega-3 Fatty Acids (FISH OIL) 1000 MG CAPS Take 1 capsule by mouth 2 (two) times daily.   Yes Historical Provider, MD  propranolol (INDERAL) 20 MG tablet Take 20 mg by mouth 2 (two) times daily.  07/03/11  Yes Historical Provider, MD  triamterene-hydrochlorothiazide (MAXZIDE-25) 37.5-25 MG per tablet Take 1 tablet by mouth at bedtime.  07/01/11  Yes Historical Provider, MD     Physical Exam: Filed Vitals:   09/01/15 0047 09/01/15 0236 09/01/15 0303 09/01/15 0338  BP: 170/91 133/74 147/79 141/68  Pulse: 80 68 76 79  Resp: 16 20 18 18   Height: 5\' 2"  (1.575 m)     Weight: 59.875 kg (132 lb)     SpO2: 99% 97% 98% 99%     Constitutional: Vital signs reviewed. Patient is a well-developed and well-nourished in no acute distress and cooperative with exam. Alert and oriented x3.  Head: Normocephalic and atraumatic  Ear: TM normal bilaterally  Mouth: no erythema or exudates, MMM  Eyes: PERRL, EOMI, conjunctivae normal, No scleral icterus.  Neck: Supple, Trachea midline normal ROM, No JVD, mass, thyromegaly, or carotid bruit present.  Cardiovascular: RRR, S1 normal, S2 normal, no MRG, pulses symmetric and intact bilaterally  Pulmonary/Chest: CTAB, no wheezes, rales, or rhonchi  Abdominal: Soft. Non-tender, non-distended, bowel sounds are normal, no masses, organomegaly, or guarding present.  GU: no CVA tenderness Musculoskeletal: No joint deformities, erythema, or  stiffness, ROM full and no nontender Ext: no edema and no cyanosis, pulses palpable bilaterally (DP and PT)  Hematology: no cervical, inginal, or axillary adenopathy.  Neurological: A&O x3, Strenght is normal and symmetric bilaterally, cranial nerve II-XII are grossly intact, no focal motor deficit, sensory intact to light touch bilaterally.  Skin: Warm, dry and intact. No rash, cyanosis, or clubbing.  Psychiatric: Normal mood and affect. speech and behavior is normal. Judgment and thought content normal. Cognition and memory are normal.      Data Review   Micro Results No results found for this or any previous visit (from the past 240 hour(s)).  Radiology Reports Ct Head Wo Contrast  09/01/2015  CLINICAL DATA:  Acute onset of difficulty completing sentences, with slurred speech and tongue tingling. Initial encounter. EXAM: CT HEAD WITHOUT CONTRAST TECHNIQUE: Contiguous axial images were obtained from the base of the skull through the vertex without intravenous contrast. COMPARISON:  None. FINDINGS: There is no evidence of acute infarction, mass lesion, or intra- or extra-axial hemorrhage on CT. Prominence of the sulci suggests mild cortical volume loss. The brainstem and fourth ventricle are within normal limits. The basal ganglia are unremarkable in  appearance. The cerebral hemispheres demonstrate grossly normal gray-white differentiation. No mass effect or midline shift is seen. There is no evidence of fracture; visualized osseous structures are unremarkable in appearance. The orbits are within normal limits. The paranasal sinuses and mastoid air cells are well-aerated. No significant soft tissue abnormalities are seen. IMPRESSION: 1. No acute intracranial pathology seen on CT. 2. Mild cortical volume loss noted. Electronically Signed   By: Garald Balding M.D.   On: 09/01/2015 02:32     CBC  Recent Labs Lab 09/01/15 0113 09/01/15 0128  WBC 6.1  --   HGB 12.3 13.6  HCT 37.5 40.0  PLT  231  --   MCV 84.5  --   MCH 27.7  --   MCHC 32.8  --   RDW 15.6*  --   LYMPHSABS 2.3  --   MONOABS 0.7  --   EOSABS 0.3  --   BASOSABS 0.0  --     Chemistries   Recent Labs Lab 09/01/15 0113 09/01/15 0128  NA  --  139  K  --  2.6*  CL  --  99*  GLUCOSE  --  99  BUN  --  24*  CREATININE  --  1.00  MG 1.9  --    ------------------------------------------------------------------------------------------------------------------ estimated creatinine clearance is 39 mL/min (by C-G formula based on Cr of 1). ------------------------------------------------------------------------------------------------------------------ No results for input(s): HGBA1C in the last 72 hours. ------------------------------------------------------------------------------------------------------------------ No results for input(s): CHOL, HDL, LDLCALC, TRIG, CHOLHDL, LDLDIRECT in the last 72 hours. ------------------------------------------------------------------------------------------------------------------ No results for input(s): TSH, T4TOTAL, T3FREE, THYROIDAB in the last 72 hours.  Invalid input(s): FREET3 ------------------------------------------------------------------------------------------------------------------ No results for input(s): VITAMINB12, FOLATE, FERRITIN, TIBC, IRON, RETICCTPCT in the last 72 hours.  Coagulation profile No results for input(s): INR, PROTIME in the last 168 hours.  No results for input(s): DDIMER in the last 72 hours.  Cardiac Enzymes No results for input(s): CKMB, TROPONINI, MYOGLOBIN in the last 168 hours.  Invalid input(s): CK ------------------------------------------------------------------------------------------------------------------ Invalid input(s): POCBNP   CBG: No results for input(s): GLUCAP in the last 168 hours.     EKG: Ordered Assessment/Plan Active Problems:   TIA (transient ischemic attack): Acute. Patient with acute  complaints of difficulty getting words out and paresthesias of the tongue now resolved. Neurology notified regarding patient and recommended transfer for further TIA workup. Suspect possible TIA versus paresthesias secondary to significant hypokalemia. -Admit patient to telemetry bed at [facility] -Neurology to see once patient arrives -Neuro checks every 2 hours -Checking lipid panel/TSH/Hbga1c/Echo/MRI/MRA/US carotid (per neurology recommendations 1 seen  Hyperlipidemia -Checking lipid panel -Continue atorvastatin per home dose    HTN (hypertension): Stable. -Held patient's combination pill which includes diuretic due to patient's     Hypokalemia: Acute. Potassium noted to be 2.6 on admission. Patient received replacement 10 meq IV prior to transfer. -Recheck BMP upon arrival  Macular degeneration: Stable patient notes that she is receiving injection treatment for this as outpatient       Code Status:   full Family Communication: bedside Disposition Plan: admit   Total time spent 55 minutes.Greater than 50% of this time was spent in counseling, explanation of diagnosis, planning of further management, and coordination of care  [facility] Pager [phone]  If 7PM-7AM, please contact night-coverage [email or web address] Password [email or web address] 09/01/2015, 4:11 AM

## 2015-09-01 NOTE — Progress Notes (Signed)
OT Cancellation Note  Patient Details Name: Sierra [name] MRN: [id number] DOB: 1941-08-30   Cancelled Treatment:    Reason Eval/Treat Not Completed: OT screened, no needs identified, will sign off. According to PT, pt is independent and at baseline with DGI score of 24/24. OT is signing off at this time. Please send text page to OT services if any questions, concerns, or with new orders: [phone] OR call office at [phone]. Thank you for the order.    CLAY,PATRICIA , MS, OTR/L, CLT Pager: [phone]  09/01/2015, 1:14 PM

## 2015-09-01 NOTE — Progress Notes (Signed)
Preliminary results by tech - Carotid Duplex Completed. No evidence of stenosis noted in bilateral carotid arteries. Vertebral arteries demonstrate antegrade flow. Oda Cogan, BS, RDMS, RVT

## 2015-09-01 NOTE — ED Notes (Addendum)
Patient reports "difficulty finding my words" while talking on the telephone at approx 2200 last evening (11/22).  Pt states approx 1hr ago she had onset of tongue numbness. Patient's family states that patient has had a few episodes of slurred speech over the last hour.  Patient denies headache, weakness, chest pain.  Patient has full ROM of all extremities and is ambulating without difficulty.  No deficits noted in triage.  Pt is speaking with clear concise speech and is answering questions appropriately. Patient is A/O x 4.

## 2015-09-02 DIAGNOSIS — G43109 Migraine with aura, not intractable, without status migrainosus: Secondary | ICD-10-CM

## 2015-09-02 DIAGNOSIS — E876 Hypokalemia: Secondary | ICD-10-CM | POA: Diagnosis not present

## 2015-09-02 DIAGNOSIS — G458 Other transient cerebral ischemic attacks and related syndromes: Secondary | ICD-10-CM | POA: Diagnosis not present

## 2015-09-02 DIAGNOSIS — E785 Hyperlipidemia, unspecified: Secondary | ICD-10-CM | POA: Diagnosis not present

## 2015-09-02 DIAGNOSIS — I10 Essential (primary) hypertension: Secondary | ICD-10-CM | POA: Diagnosis not present

## 2015-09-02 LAB — HEMOGLOBIN A1C
Hgb A1c MFr Bld: 6 % — ABNORMAL HIGH (ref 4.8–5.6)
MEAN PLASMA GLUCOSE: 126 mg/dL

## 2015-09-02 MED ORDER — TRIAMTERENE-HCTZ 37.5-25 MG PO TABS: 1.0000 | ORAL_TABLET | Freq: Every day | ORAL | Status: AC

## 2015-09-02 MED ORDER — PANTOPRAZOLE SODIUM 20 MG PO TBEC: 20.0000 mg | DELAYED_RELEASE_TABLET | Freq: Every day | ORAL | Status: DC

## 2015-09-02 MED ORDER — ASPIRIN EC 325 MG PO TBEC: 325.0000 mg | DELAYED_RELEASE_TABLET | Freq: Every day | ORAL | Status: DC

## 2015-09-02 NOTE — Progress Notes (Signed)
Pt discharged at this time taking all personal belongings. Discharge instructions provided with prescriptions with verbal understanding. No noted distress. Pt denies pain or discomfort. Pt made aware to follow up with MD.

## 2015-09-02 NOTE — Discharge Summary (Signed)
Physician Discharge Summary  Sierra [name] [id number] DOB: 01-Feb-1941 DOA: 09/01/2015  PCP: Geoffery Lyons, MD  Admit date: 09/01/2015 Discharge date: 09/02/2015  Time spent: 25 minutes  Recommendations for Outpatient Follow-up:  1. Follow up with neurology as an outpatient in 2-4 weeks.   Discharge Diagnoses:  Principal Problem:   TIA (transient ischemic attack) Active Problems:   HTN (hypertension)   Hypokalemia   Macular degeneration   Hyperlipidemia   Discharge Condition: stable  Diet recommendation: heart healthy  Filed Weights   09/01/15 0047 09/01/15 0600  Weight: 59.875 kg (132 lb) 61.236 kg (135 lb)    History of present illness:  74 year old with past medical history of hypertension macular degeneration chronic kidney disease who presents to the hospital with strokelike symptoms are started the day prior to admission.  Hospital Course:  TIA: Hemoglobin A1c was less than 6, HDL is greater than 70 LDL greater than 100 continue Lipitor. MRI of the brain show chronic microvascular ischemic changes. PT OT are going to no further follow-ups. Carotid Doppler showed no significant aortic stenosis. She will continue aspirin 325 at home. She had no events on cardiac telemetry.  Essential hypertension: Antihypertensive medications were held and admission she will resume them as an outpatient.  Procedures:  MRI brain  ECHO  Carotid  CT head  Consultations:  neurology  Discharge Exam: Filed Vitals:   09/02/15 0118 09/02/15 0533  BP: 117/68 119/77  Pulse: 68 70  Temp: 98 F (36.7 C) 98 F (36.7 C)  Resp: 18 16    General: A&O x3 Cardiovascular: RRR Respiratory: good air movement CAT B/L  Discharge Instructions   Discharge Instructions    Diet - low sodium heart healthy    Complete by:  As directed      Increase activity slowly    Complete by:  As directed           Current Discharge Medication List    START taking these  medications   Details  aspirin EC 325 MG tablet Take 1 tablet (325 mg total) by mouth daily. Qty: 30 tablet, Refills: 0    pantoprazole (PROTONIX) 20 MG tablet Take 1 tablet (20 mg total) by mouth daily. Qty: 30 tablet, Refills: 0      CONTINUE these medications which have CHANGED   Details  triamterene-hydrochlorothiazide (MAXZIDE-25) 37.5-25 MG tablet Take 1 tablet by mouth at bedtime.      CONTINUE these medications which have NOT CHANGED   Details  atorvastatin (LIPITOR) 40 MG tablet Take 40 mg by mouth daily at 6 PM.     Calcium Carb-Cholecalciferol (CALCIUM + D3) 600-200 MG-UNIT TABS Take 1 tablet by mouth 2 (two) times daily.    Coenzyme Q10 (CO Q 10 PO) Take 1 capsule by mouth daily.    docusate sodium (COLACE) 100 MG capsule Take 100 mg by mouth daily.    Lysine 1000 MG TABS Take 500 mg by mouth 2 (two) times daily.     Misc Natural Products (TURMERIC CURCUMIN) CAPS Take 1 capsule by mouth 2 (two) times daily.    Omega-3 Fatty Acids (FISH OIL) 1000 MG CAPS Take 1 capsule by mouth 2 (two) times daily.    propranolol (INDERAL) 20 MG tablet Take 20 mg by mouth 2 (two) times daily.       STOP taking these medications     Aspirin (ASPIR-81 PO)      Aspirin-Acetaminophen-Caffeine (GOODY HEADACHE PO)        No Known  Allergies Follow-up Information    Follow up with Xu,Jindong, MD In 3 weeks.   Specialty:  Neurology   Why:  hospital follow up   Contact information:   [address] [place] [id number] [phone]        The results of significant diagnostics from this hospitalization (including imaging, microbiology, ancillary and laboratory) are listed below for reference.    Significant Diagnostic Studies: Ct Head Wo Contrast  09/01/2015  CLINICAL DATA:  Acute onset of difficulty completing sentences, with slurred speech and tongue tingling. Initial encounter. EXAM: CT HEAD WITHOUT CONTRAST TECHNIQUE: Contiguous axial images were  obtained from the base of the skull through the vertex without intravenous contrast. COMPARISON:  None. FINDINGS: There is no evidence of acute infarction, mass lesion, or intra- or extra-axial hemorrhage on CT. Prominence of the sulci suggests mild cortical volume loss. The brainstem and fourth ventricle are within normal limits. The basal ganglia are unremarkable in appearance. The cerebral hemispheres demonstrate grossly normal gray-white differentiation. No mass effect or midline shift is seen. There is no evidence of fracture; visualized osseous structures are unremarkable in appearance. The orbits are within normal limits. The paranasal sinuses and mastoid air cells are well-aerated. No significant soft tissue abnormalities are seen. IMPRESSION: 1. No acute intracranial pathology seen on CT. 2. Mild cortical volume loss noted. Electronically Signed   By: Garald Balding M.D.   On: 09/01/2015 02:32   Mr Brain Wo Contrast  09/01/2015  CLINICAL DATA:  Transient episode of speech difficulty. Numbness in her tongue. Dizziness EXAM: MRI HEAD WITHOUT CONTRAST MRA HEAD WITHOUT CONTRAST TECHNIQUE: Multiplanar, multiecho pulse sequences of the brain and surrounding structures were obtained without intravenous contrast. Angiographic images of the head were obtained using MRA technique without contrast. COMPARISON:  CT head without contrast from the same day. FINDINGS: MRI HEAD FINDINGS The diffusion-weighted images demonstrate no evidence for acute or subacute infarction. No acute hemorrhage or mass lesion is present. Scattered subcortical T2 hyperintensities are evident bilaterally. These are slightly greater than expected for age. Minimal periventricular T2 changes are present. The brainstem is within normal limits. The cerebellum is unremarkable. The internal auditory canals are within normal limits. Flow is present in the major intracranial arteries. The globes and orbits are intact. The skullbase is within normal  limits. Midline structures are unremarkable. MRA HEAD FINDINGS There is some tortuosity of the right internal carotid artery without stenosis. The internal carotid arteries are otherwise within normal limits to the ICA termini bilaterally. The A1 and M1 segments are normal. A posterior communicating artery is present on the right. The MCA bifurcations are intact. ACA and MCA branch vessels are unremarkable. The vertebral arteries are codominant. The PICA origins are visualized and normal. AICA vessels are seen bilaterally. The right posterior cerebral artery is of fetal type. PCA branch vessels are within normal limits. IMPRESSION: 1. No evidence for acute or subacute infarction. 2. Scattered subcortical T2 hyperintensities bilaterally are greater than expected for age and likely reflect the sequela of chronic microvascular ischemia. 3. Normal variant MRA circle of Willis without evidence for significant proximal stenosis, aneurysm, or branch vessel occlusion. Electronically Signed   By: San Morelle M.D.   On: 09/01/2015 11:44   Mr Jodene Nam Head/brain Wo Cm  09/01/2015  CLINICAL DATA:  Transient episode of speech difficulty. Numbness in her tongue. Dizziness EXAM: MRI HEAD WITHOUT CONTRAST MRA HEAD WITHOUT CONTRAST TECHNIQUE: Multiplanar, multiecho pulse sequences of the brain and surrounding structures were obtained without  intravenous contrast. Angiographic images of the head were obtained using MRA technique without contrast. COMPARISON:  CT head without contrast from the same day. FINDINGS: MRI HEAD FINDINGS The diffusion-weighted images demonstrate no evidence for acute or subacute infarction. No acute hemorrhage or mass lesion is present. Scattered subcortical T2 hyperintensities are evident bilaterally. These are slightly greater than expected for age. Minimal periventricular T2 changes are present. The brainstem is within normal limits. The cerebellum is unremarkable. The internal auditory canals are  within normal limits. Flow is present in the major intracranial arteries. The globes and orbits are intact. The skullbase is within normal limits. Midline structures are unremarkable. MRA HEAD FINDINGS There is some tortuosity of the right internal carotid artery without stenosis. The internal carotid arteries are otherwise within normal limits to the ICA termini bilaterally. The A1 and M1 segments are normal. A posterior communicating artery is present on the right. The MCA bifurcations are intact. ACA and MCA branch vessels are unremarkable. The vertebral arteries are codominant. The PICA origins are visualized and normal. AICA vessels are seen bilaterally. The right posterior cerebral artery is of fetal type. PCA branch vessels are within normal limits. IMPRESSION: 1. No evidence for acute or subacute infarction. 2. Scattered subcortical T2 hyperintensities bilaterally are greater than expected for age and likely reflect the sequela of chronic microvascular ischemia. 3. Normal variant MRA circle of Willis without evidence for significant proximal stenosis, aneurysm, or branch vessel occlusion. Electronically Signed   By: San Morelle M.D.   On: 09/01/2015 11:44    Microbiology: No results found for this or any previous visit (from the past 240 hour(s)).   Labs: Basic Metabolic Panel:  Recent Labs Lab 09/01/15 0113 09/01/15 0128 09/01/15 0710  NA  --  139 140  K  --  2.6* 3.8  CL  --  99* 107  CO2  --   --  25  GLUCOSE  --  99 106*  BUN  --  24* 15  CREATININE  --  1.00 0.92  CALCIUM  --   --  9.4  MG 1.9  --   --    Liver Function Tests: No results for input(s): AST, ALT, ALKPHOS, BILITOT, PROT, ALBUMIN in the last 168 hours. No results for input(s): LIPASE, AMYLASE in the last 168 hours. No results for input(s): AMMONIA in the last 168 hours. CBC:  Recent Labs Lab 09/01/15 0113 09/01/15 0128  WBC 6.1  --   NEUTROABS 2.7  --   HGB 12.3 13.6  HCT 37.5 40.0  MCV 84.5   --   PLT 231  --    Cardiac Enzymes: No results for input(s): CKTOTAL, CKMB, CKMBINDEX, TROPONINI in the last 168 hours. BNP: BNP (last 3 results) No results for input(s): BNP in the last 8760 hours.  ProBNP (last 3 results) No results for input(s): PROBNP in the last 8760 hours.  CBG: No results for input(s): GLUCAP in the last 168 hours.     Signed:  [name]  [facility] 09/02/2015, 10:59 AM

## 2015-09-02 NOTE — Progress Notes (Signed)
STROKE TEAM PROGRESS NOTE   HISTORY Sierra [name] is an 74 y.o. female who reports yesterday night 08/31/2015 around 9:30 to 10 PM she was talking on the phone and initially noticed difficulty in getting her words out. Denies having any slurred speech or visual changes at the time. She reported associated symptoms of feeling dizzy or lightheaded. The symptoms resolved and she went upstairs however her daughter called her back downstairs around approximately 11 PM to look at something. When she came back downstairs and in trying to comment on what she was looking at she had difficulty with getting words out like the "B" in beautiful. Also at this time noticed that the tip of her tongue felt numb/tingly. In relating these symptoms to her daughter they became concerned for the possibility of stroke and became immediately to the emergency department. [facility] to [facility]ology evaluate. She currently is asymptomatic. She endorses she takes ASA 81 mg daily. Modified Rankin: Rankin Score=0. Patient was not administered TPA secondary to symptoms resolved. She was admitted for further evaluation and treatment.   SUBJECTIVE (INTERVAL HISTORY) No family is at the bedside.  Overall she feels her condition is stable. She has hx of migraine headache, currently on propranolol, and previous followed with Dr. Erling Cruz in clinic. Now her migraine is not as bad as before and full blown migraine was 5 years ago. However, she still has mild HA 2 times per week. Tylenol will abort the HA. No aura.    OBJECTIVE Temp:  [98 F (36.7 C)-98.5 F (36.9 C)] 98 F (36.7 C) (11/24 0533) Pulse Rate:  [65-75] 70 (11/24 0533) Cardiac Rhythm:  [-] Sinus bradycardia (11/24 0700) Resp:  [16-20] 16 (11/24 0533) BP: (117-138)/(59-97) 119/77 mmHg (11/24 0533) SpO2:  [96 %-98 %] 98 % (11/24 0533)  CBC:  Recent Labs Lab 09/01/15 0113 09/01/15 0128  WBC 6.1  --   NEUTROABS 2.7  --   HGB 12.3 13.6  HCT 37.5  40.0  MCV 84.5  --   PLT 231  --     Basic Metabolic Panel:  Recent Labs Lab 09/01/15 0113 09/01/15 0128 09/01/15 0710  NA  --  139 140  K  --  2.6* 3.8  CL  --  99* 107  CO2  --   --  25  GLUCOSE  --  99 106*  BUN  --  24* 15  CREATININE  --  1.00 0.92  CALCIUM  --   --  9.4  MG 1.9  --   --     Lipid Panel:    Component Value Date/Time   CHOL 190 09/01/2015 0710   TRIG 82 09/01/2015 0710   HDL 70 09/01/2015 0710   CHOLHDL 2.7 09/01/2015 0710   VLDL 16 09/01/2015 0710   LDLCALC 104* 09/01/2015 0710   HgbA1c:  Lab Results  Component Value Date   HGBA1C 6.0* 09/01/2015   Urine Drug Screen: No results found for: LABOPIA, COCAINSCRNUR, LABBENZ, AMPHETMU, THCU, LABBARB    IMAGING I have personally reviewed the radiological images below and agree with the radiology interpretations.  Ct Head Wo Contrast 09/01/2015   1. No acute intracranial pathology seen on CT. 2. Mild cortical volume loss noted.   Mri & Mra Head/brain Wo Cm 09/01/2015   1. No evidence for acute or subacute infarction. 2. Scattered subcortical T2 hyperintensities bilaterally are greater than expected for age and likely reflect the sequela of chronic microvascular ischemia. 3. Normal variant  MRA circle of Willis without evidence for significant proximal stenosis, aneurysm, or branch vessel occlusion.    Carotid Doppler   No evidence of stenosis noted in bilateral carotid arteries. Vertebral arteries demonstrate antegrade flow.  2D Echocardiogram  - Left ventricle: The cavity size was normal. Wall thickness wasincreased in a pattern of mild LVH. Systolic function was normal.The estimated ejection fraction was 55%. Wall motion was normal;there were no regional wall motion abnormalities. Doppler parameters are consistent with abnormal left ventricularrelaxation (grade 1 diastolic dysfunction). - Left atrium: The atrium was moderately dilated. - Atrial septum: No defect or patent foramen ovale was  identified.   PHYSICAL EXAM  General - Well nourished, well developed, in no apparent distress.  Ophthalmologic - Fundi not visualized due to noncooperation.  Cardiovascular - Regular rate and rhythm with no murmur.  Mental Status -  Level of arousal and orientation to time, place, and person were intact. Language including expression, naming, repetition, comprehension was assessed and found intact. Attention span and concentration were normal. Recent and remote memory were intact. Fund of Knowledge was assessed and was intact.  Cranial Nerves II - XII - II - Visual field intact OU. III, IV, VI - Extraocular movements intact. V - Facial sensation intact bilaterally. VII - Facial movement intact bilaterally. VIII - Hearing & vestibular intact bilaterally. X - Palate elevates symmetrically. XI - Chin turning & shoulder shrug intact bilaterally. XII - Tongue protrusion intact.  Motor Strength - The patient's strength was normal in all extremities and pronator drift was absent.  Bulk was normal and fasciculations were absent.   Motor Tone - Muscle tone was assessed at the neck and appendages and was normal.  Reflexes - The patient's reflexes were 1+ in all extremities and she had no pathological reflexes.  Sensory - Light touch, temperature/pinprick were assessed and were symmetrical.    Coordination - The patient had normal movements in the hands and feet with no ataxia or dysmetria.  Tremor was absent.  Gait and Station - The patient's transfers, posture, gait, station, and turns were observed as normal.   ASSESSMENT/PLAN [name] is a 74 y.o. female with history of hypertension, macular degeneration, hyperlipidemia, osteoporosis, and chronic kidney disease presenting with difficulty getting her words out. She did not receive IV t-PA due to symptoms resolving.   Migraine equivalent vs TIA  Hx Migraines, last full blown HA was 5 years ago. Does have routine HA on  the L side of her head, but never gets "full blown". Feel like she is getting a migraine but never develops. Takes propanolol for HA. Migraines started after going off estrogen. Imitrex helps as well.   MRI  No acute stroke  MRA  No large vessel stenosis  Carotid Doppler  No significant stenosis   2D Echo  No source of embolus   LDL 104  HgbA1c 6.0  Lovenox 40 mg sq daily for VTE prophylaxis  Diet Heart Room service appropriate?: Yes; Fluid consistency:: Thin  aspirin 81 mg daily prior to admission, now on aspirin 81 mg daily enteric coated. Recommend ASA 325mg  at time of discharge.  Patient counseled to be compliant with her antithrombotic medications  Ongoing aggressive stroke risk factor management  Therapy recommendations:  No PT  Disposition:  Return home  [facility] for discharge from stroke standpoint  No indication for neuro followup from this hospitalization.   Hypertension  Stable  Hyperlipidemia  Home meds:  lipitor 40 mg, resumed in hospital  LDL  104, goal < 70  Continue statin at discharge  Other Stroke Risk Factors  Advanced age  ETOH use  Other Active Problems  hypokalemia  Hospital day # 1  Neurology will sign off. Please call with questions. No neuro follow up needed at this time. Thanks for the consult.  Rosalin Hawking, MD PhD Stroke Neurology 09/03/2015 3:29 PM     To contact Stroke Continuity provider, please refer to [email or web address] After hours, contact General Neurology

## 2015-09-03 DIAGNOSIS — G43109 Migraine with aura, not intractable, without status migrainosus: Secondary | ICD-10-CM | POA: Insufficient documentation

## 2015-10-21 ENCOUNTER — Encounter: Payer: Self-pay | Admitting: Neurology

## 2015-10-21 ENCOUNTER — Ambulatory Visit (INDEPENDENT_AMBULATORY_CARE_PROVIDER_SITE_OTHER): Payer: Federal, State, Local not specified - PPO | Admitting: Neurology

## 2015-10-21 ENCOUNTER — Encounter (INDEPENDENT_AMBULATORY_CARE_PROVIDER_SITE_OTHER): Payer: Self-pay

## 2015-10-21 VITALS — BP 141/77 | HR 70 | Ht 62.0 in | Wt 135.0 lb

## 2015-10-21 DIAGNOSIS — G458 Other transient cerebral ischemic attacks and related syndromes: Secondary | ICD-10-CM | POA: Diagnosis not present

## 2015-10-21 DIAGNOSIS — G43009 Migraine without aura, not intractable, without status migrainosus: Secondary | ICD-10-CM | POA: Diagnosis not present

## 2015-10-21 HISTORY — DX: Migraine without aura, not intractable, without status migrainosus: G43.009

## 2015-10-21 MED ORDER — CLOPIDOGREL BISULFATE 75 MG PO TABS: 75.0000 mg | ORAL_TABLET | Freq: Every day | ORAL | Status: DC

## 2015-10-21 NOTE — Progress Notes (Signed)
Reason for visit: TIA  Referring physician: [facility]  Sierra [name] is a 75 y.o. female  History of present illness:  Sierra [name] is a 75 year old left-handed white female with a history of migraine headaches, previously treated through this office by Dr. Erling Cruz. The patient was recently in the hospital on 09/01/2015 with an event that was felt secondary to a TIA. She noted onset of speech problems, slurring of the speech and having difficulty sounding out the letter "B". The patient then developed numbness on the lower face on the right, and some numbness of the right hand and thumb. She denied any weakness, visual field changes, or gait instability. She had no significant headache around this time. The entire episode lasted about 15 minutes with full clearing. The patient underwent a workup that included MRI of the brain, and MRA of the head. The MRI the brain showed mild chronic white matter changes. MRA of the head was unremarkable, carotid Doppler study was unremarkable, 2-D echocardiogram did not show evidence of a cardiogenic source of TIA or stroke. The patient was increased from 81 mg of aspirin to 325 mg of aspirin. Since being out of the hospital, she has converted back to the 81 mg tablets. The patient was noted to have an LDL level of 104, hemoglobin A1c of 6.0. The patient has a history of migraine headaches, but her headaches are much less frequent at this time, they were markedly improved on propranolol. In the past she has had episodes of migraine equivalent associated with fortification spectra involving the visual fields primarily off to the right. The last such event was one year ago. Since her hospitalization in November 2016, the patient has had multiple stereotypical events of slurred speech, right face numbness and right arm numbness. The events will last about 15 minutes, then clear. On the morning of this evaluation, the patient had 3 such events back-to-back with full  clearing in between. The patient gets no headaches before, during, or after the episodes. She has had a total of about 10 such events since November. She denies any weakness of the legs, or numbness of the legs with the events. She comes to this office for an evaluation.  Past Medical History  Diagnosis Date  . Hypertension   . 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 Osteoporosis   . Macular degeneration     Eyelea injections every 16 weeks  . Common migraine 10/21/2015    Past Surgical History  Procedure Laterality Date  . Colonoscopy      screenin gonly benign polyps removed  . Nephrectomy  1963    left  . Tubal ligation  1975    Family History  Problem Relation Age of Onset  . Colon cancer Neg Hx   . Esophageal cancer Neg Hx   . Stomach cancer Neg Hx   . Thyroid disease Mother   . Dementia Father     Social history:  reports that she has never smoked. She has never used smokeless tobacco. She reports that she does not drink alcohol or use illicit drugs.  Medications:  Prior to Admission medications   Medication Sig Start Date End Date Taking? Authorizing Provider  aspirin EC 325 MG tablet Take 1 tablet (325 mg total) by mouth daily. 09/02/15   [name], MD  atorvastatin (LIPITOR) 40 MG tablet Take 40 mg by mouth daily at 6 PM.  07/01/11  Historical Provider, MD  Calcium Carb-Cholecalciferol (CALCIUM + D3) 600-200 MG-UNIT TABS Take 1 tablet by mouth 2 (two) times daily.    Historical Provider, MD  Coenzyme Q10 (CO Q 10 PO) Take 1 capsule by mouth daily.    Historical Provider, MD  docusate sodium (COLACE) 100 MG capsule Take 100 mg by mouth daily.    Historical Provider, MD  Lysine 1000 MG TABS Take 500 mg by mouth 2 (two) times daily.     Historical Provider, MD  Misc Natural Products (TURMERIC CURCUMIN) CAPS Take 1 capsule by mouth 2 (two) times daily.    Historical Provider, MD  Omega-3 Fatty Acids (FISH OIL) 1000 MG CAPS  Take 1 capsule by mouth 2 (two) times daily.    Historical Provider, MD  pantoprazole (PROTONIX) 20 MG tablet Take 1 tablet (20 mg total) by mouth daily. 09/02/15   [name], MD  propranolol (INDERAL) 20 MG tablet Take 20 mg by mouth 2 (two) times daily.  07/03/11   Historical Provider, MD  triamterene-hydrochlorothiazide (MAXZIDE-25) 37.5-25 MG tablet Take 1 tablet by mouth at bedtime. 09/06/15   [name], MD     No Known Allergies  ROS:  Out of a complete 14 system review of symptoms, the patient complains only of the following symptoms, and all other reviewed systems are negative.  Numbness, slurred speech  Blood pressure 141/77, pulse 70, height 5\' 2"  (1.575 m), weight 135 lb (61.236 kg).  Physical Exam  General: The patient is alert and cooperative at the time of the examination.  Eyes: Pupils are equal, round, and reactive to light. Discs are flat bilaterally.  Neck: The neck is supple, no carotid bruits are noted.  Respiratory: The respiratory examination is clear.  Cardiovascular: The cardiovascular examination reveals a regular rate and rhythm, no obvious murmurs or rubs are noted.  Skin: Extremities are without significant edema.  Neurologic Exam  Mental status: The patient is alert and oriented x 3 at the time of the examination. The patient has apparent normal recent and remote memory, with an apparently normal attention span and concentration ability.  Cranial nerves: Facial symmetry is present. There is good sensation of the face to pinprick and soft touch bilaterally. The strength of the facial muscles and the muscles to head turning and shoulder shrug are normal bilaterally. Speech is well enunciated, no aphasia or dysarthria is noted. Extraocular movements are full. Visual fields are full. The tongue is midline, and the patient has symmetric elevation of the soft palate. No obvious hearing deficits are noted.  Motor: The motor testing reveals  5 over 5 strength of all 4 extremities. Good symmetric motor tone is noted throughout.  Sensory: Sensory testing is intact to pinprick, soft touch, vibration sensation, and position sense on all 4 extremities. No evidence of extinction is noted.  Coordination: Cerebellar testing reveals good finger-nose-finger and heel-to-shin bilaterally.  Gait and station: Gait is normal. Tandem gait is normal. Romberg is negative. No drift is seen.  Reflexes: Deep tendon reflexes are symmetric and normal bilaterally. Toes are downgoing bilaterally.   CT Head Wo Contrast 09/01/2015 1. No acute intracranial pathology seen on CT. 2. Mild cortical volume loss noted.   Mri & Mra Head/brain Wo Cm 09/01/2015 1. No evidence for acute or subacute infarction. 2. Scattered subcortical T2 hyperintensities bilaterally are greater than expected for age and likely reflect the sequela of chronic microvascular ischemia. 3. Normal variant MRA circle of Willis without evidence for significant proximal stenosis,  aneurysm, or branch vessel occlusion. * MRI scan images were reviewed online. I agree with the written report.    Carotid Doppler  No evidence of stenosis noted in bilateral carotid arteries. Vertebral arteries demonstrate antegrade flow.  2D Echocardiogram  - Left ventricle: The cavity size was normal. Wall thickness wasincreased in a pattern of mild LVH. Systolic function was normal.The estimated ejection fraction was 55%. Wall motion was normal;there were no regional wall motion abnormalities. Doppler parameters are consistent with abnormal left ventricularrelaxation (grade 1 diastolic dysfunction). - Left atrium: The atrium was moderately dilated. - Atrial septum: No defect or patent foramen ovale was identified.   Assessment/Plan:  1. History of migraine headache  2. Recurring TIA like events  The patient is having multiple events of right face and arm numbness, slurred speech. Each event is  identical to the next. The clinical course is unusual for true TIA events, but further workup may be indicated. The patient will be sent for CT angiogram of the head and neck, and Plavix will be added to the aspirin for the next 90 days. The patient will be set up for an EEG study to exclude seizures. It is possible that she is having migraine equivalent events. If the above workup is unremarkable, the propranolol dose may be increased. She will follow-up in 3 months. If the patient has a prolonged episode, she is to go to the emergency room.   [name] MD 10/21/2015 6:54 PM  [facility] [address] [place], [place] [address]  Phone [phone] Fax [phone]

## 2015-10-21 NOTE — Patient Instructions (Signed)
We will get a CT angiogram of the head and neck and get an EEG study. We will add plavix to the aspirin therapy for the next 90 days.   Stroke Prevention Some medical conditions and behaviors are associated with an increased chance of having a stroke. You may prevent a stroke by making healthy choices and managing medical conditions. HOW CAN I REDUCE MY RISK OF HAVING A STROKE?   Stay physically active. Get at least 30 minutes of activity on most or all days.  Do not smoke. It may also be helpful to avoid exposure to secondhand smoke.  Limit alcohol use. Moderate alcohol use is considered to be:  No more than 2 drinks per day for men.  No more than 1 drink per day for nonpregnant women.  Eat healthy foods. This involves:  Eating 5 or more servings of fruits and vegetables a day.  Making dietary changes that address high blood pressure (hypertension), high cholesterol, diabetes, or obesity.  Manage your cholesterol levels.  Making food choices that are high in fiber and low in saturated fat, trans fat, and cholesterol may control cholesterol levels.  Take any prescribed medicines to control cholesterol as directed by your health care provider.  Manage your diabetes.  Controlling your carbohydrate and sugar intake is recommended to manage diabetes.  Take any prescribed medicines to control diabetes as directed by your health care provider.  Control your hypertension.  Making food choices that are low in salt (sodium), saturated fat, trans fat, and cholesterol is recommended to manage hypertension.  Ask your health care provider if you need treatment to lower your blood pressure. Take any prescribed medicines to control hypertension as directed by your health care provider.  If you are 17-6 years of age, have your blood pressure checked every 3-5 years. If you are 79 years of age or older, have your blood pressure checked every year.  Maintain a healthy  weight.  Reducing calorie intake and making food choices that are low in sodium, saturated fat, trans fat, and cholesterol are recommended to manage weight.  Stop drug abuse.  Avoid taking birth control pills.  Talk to your health care provider about the risks of taking birth control pills if you are over 84 years old, smoke, get migraines, or have ever had a blood clot.  Get evaluated for sleep disorders (sleep apnea).  Talk to your health care provider about getting a sleep evaluation if you snore a lot or have excessive sleepiness.  Take medicines only as directed by your health care provider.  For some people, aspirin or blood thinners (anticoagulants) are helpful in reducing the risk of forming abnormal blood clots that can lead to stroke. If you have the irregular heart rhythm of atrial fibrillation, you should be on a blood thinner unless there is a good reason you cannot take them.  Understand all your medicine instructions.  Make sure that other conditions (such as anemia or atherosclerosis) are addressed. SEEK IMMEDIATE MEDICAL CARE IF:   You have sudden weakness or numbness of the face, arm, or leg, especially on one side of the body.  Your face or eyelid droops to one side.  You have sudden confusion.  You have trouble speaking (aphasia) or understanding.  You have sudden trouble seeing in one or both eyes.  You have sudden trouble walking.  You have dizziness.  You have a loss of balance or coordination.  You have a sudden, severe headache with no known  cause.  You have new chest pain or an irregular heartbeat. Any of these symptoms may represent a serious problem that is an emergency. Do not wait to see if the symptoms will go away. Get medical help at once. Call your local emergency services (911 in [place] Do not drive yourself to the hospital.   This information is not intended to replace advice given to you by your health care provider. Make sure you  discuss any questions you have with your health care provider.   Document Released: 11/02/2004 Document Revised: 10/16/2014 Document Reviewed: 03/28/2013 Elsevier Interactive Patient Education [employer].

## 2015-10-28 ENCOUNTER — Ambulatory Visit
Admission: RE | Admit: 2015-10-28 | Discharge: 2015-10-28 | Disposition: A | Discharge status: Home / Self Care | Payer: Federal, State, Local not specified - PPO | Source: Ambulatory Visit | Attending: Neurology | Admitting: Neurology

## 2015-10-28 ENCOUNTER — Telehealth: Payer: Self-pay | Admitting: Neurology

## 2015-10-28 DIAGNOSIS — G458 Other transient cerebral ischemic attacks and related syndromes: Secondary | ICD-10-CM

## 2015-10-28 DIAGNOSIS — G43009 Migraine without aura, not intractable, without status migrainosus: Secondary | ICD-10-CM

## 2015-10-28 MED ORDER — IOPAMIDOL (ISOVUE-370) INJECTION 76%
100.0000 mL | Freq: Once | INTRAVENOUS | Status: AC | PRN
  Administered 2015-10-28: 100 mL via INTRAVENOUS

## 2015-10-28 NOTE — Telephone Encounter (Signed)
I called the patient. The CT angiogram study is unremarkable, we'll check an EEG study.  CTA head and neck 10/28/15:  IMPRESSION: 1. No atherosclerosis in the neck. Minimal ICA siphon atherosclerosis. Mild distal aortic arch calcified atherosclerosis. 2. No intracranial or extracranial stenosis. 3. 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 4. 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 5. Stable and normal CT appearance of the brain.

## 2015-11-16 ENCOUNTER — Telehealth: Payer: Self-pay | Admitting: Neurology

## 2015-11-16 ENCOUNTER — Ambulatory Visit: Payer: Self-pay | Admitting: Neurology

## 2015-11-16 ENCOUNTER — Ambulatory Visit (INDEPENDENT_AMBULATORY_CARE_PROVIDER_SITE_OTHER): Payer: Federal, State, Local not specified - PPO | Admitting: Neurology

## 2015-11-16 DIAGNOSIS — G43009 Migraine without aura, not intractable, without status migrainosus: Secondary | ICD-10-CM

## 2015-11-16 DIAGNOSIS — R4701 Aphasia: Secondary | ICD-10-CM | POA: Diagnosis not present

## 2015-11-16 DIAGNOSIS — G458 Other transient cerebral ischemic attacks and related syndromes: Secondary | ICD-10-CM

## 2015-11-16 MED ORDER — PROPRANOLOL HCL 40 MG PO TABS: 40.0000 mg | ORAL_TABLET | Freq: Two times a day (BID) | ORAL | Status: DC

## 2015-11-16 NOTE — Procedures (Signed)
    History:  Sierra [name] is a 75 year old patient with a history of multiple episodes of speech disturbance and right arm numbness that have occurred over the last 3 or 4 months. The patient is being evaluated for possible seizure-type events. Cerebrovascular workup has been unremarkable.   This is a routine EEG. No skull defects are noted. Medications include aspirin, Lipitor, calcium supplementation, Plavix, docusate, fish oil, propranolol, and Maxide.   EEG classification: Normal awake and drowsy  Description of the recording: The background rhythms of this recording consists of a fairly well modulated medium amplitude alpha rhythm of 9 Hz that is reactive to eye opening and closure. As the record progresses, the patient appears to remain in the waking state throughout the recording. Photic stimulation was performed, resulting in a bilateral and symmetric photic driving response. Hyperventilation was also performed, resulting in a minimal buildup of the background rhythm activities without significant slowing seen. Toward the end of the recording, the patient enters the drowsy state with slight symmetric slowing seen. The patient never enters stage II sleep. At no time during the recording does there appear to be evidence of spike or spike wave discharges or evidence of focal slowing. EKG monitor shows no evidence of cardiac rhythm abnormalities with a heart rate of 60.  Impression: This is a normal EEG recording in the waking and drowsy state. No evidence of ictal or interictal discharges are seen.

## 2015-11-16 NOTE — Telephone Encounter (Signed)
I called patient. The EEG study was unremarkable. CT angiogram was also unrevealing, I will increase the propranolol taking 40 mg twice daily. The patient is not having the speech episodes at this point, but she is having almost daily sensory alterations on the right face. She is on aspirin and Plavix now.

## 2016-01-20 ENCOUNTER — Other Ambulatory Visit: Payer: Self-pay

## 2016-01-20 MED ORDER — CLOPIDOGREL BISULFATE 75 MG PO TABS: 75.0000 mg | ORAL_TABLET | Freq: Every day | ORAL | Status: DC

## 2016-01-20 NOTE — Telephone Encounter (Signed)
Refill request faxed from pharmacy. Retailed as requested.

## 2016-01-26 ENCOUNTER — Encounter: Payer: Self-pay | Admitting: Neurology

## 2016-01-26 ENCOUNTER — Ambulatory Visit (INDEPENDENT_AMBULATORY_CARE_PROVIDER_SITE_OTHER): Payer: Federal, State, Local not specified - PPO | Admitting: Neurology

## 2016-01-26 VITALS — BP 122/72 | HR 57 | Ht 62.0 in | Wt 133.5 lb

## 2016-01-26 DIAGNOSIS — G43109 Migraine with aura, not intractable, without status migrainosus: Secondary | ICD-10-CM

## 2016-01-26 DIAGNOSIS — G43009 Migraine without aura, not intractable, without status migrainosus: Secondary | ICD-10-CM | POA: Diagnosis not present

## 2016-01-26 NOTE — Progress Notes (Signed)
Reason for visit: Numbness, speech disturbance  Sierra [name] is an 75 y.o. female  History of present illness:  Sierra [name] is a 75 year old left-handed white female with a history of migraine headaches and episodes of right facial numbness associated with slurred speech. The patient has undergone a CT angiogram of the head and neck that was unremarkable, and an EEG evaluation was normal. The propranolol dose was increased to 40 mg twice daily, and this increase has markedly improved her episodes. She was having daily events, and now the events have essentially been eliminated, she may have on occasion a slight sensation alteration on the left lower face, but this is very mild. The patient still has some occasional headaches around the left eye that are her usual headache, and may be daily in nature usually in the morning lasting several days, then go away. Oftentimes weather changes worsen the headache. The patient feels much better at this time, she is tolerating the Inderal well. She remains on aspirin and Plavix.  Past Medical History  Diagnosis Date  . Hypertension   . 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 Osteoporosis   . Macular degeneration     Eyelea injections every 16 weeks  . Common migraine 10/21/2015    Past Surgical History  Procedure Laterality Date  . Colonoscopy      screenin gonly benign polyps removed  . Nephrectomy  1963    left  . Tubal ligation  1975    Family History  Problem Relation Age of Onset  . Colon cancer Neg Hx   . Esophageal cancer Neg Hx   . Stomach cancer Neg Hx   . Thyroid disease Mother   . Dementia Father     Social history:  reports that she has never smoked. She has never used smokeless tobacco. She reports that she does not drink alcohol or use illicit drugs.   No Known Allergies  Medications:  Prior to Admission medications   Medication Sig Start Date End Date Taking? Authorizing  Provider  aspirin 81 MG tablet Take 81 mg by mouth daily.   Yes Historical Provider, MD  atorvastatin (LIPITOR) 40 MG tablet Take 40 mg by mouth daily at 6 PM.  07/01/11  Yes Historical Provider, MD  Calcium Carb-Cholecalciferol (CALCIUM + D3) 600-200 MG-UNIT TABS Take 1 tablet by mouth 2 (two) times daily.   Yes Historical Provider, MD  Coenzyme Q10 (CO Q 10 PO) Take 1 capsule by mouth daily.   Yes Historical Provider, MD  docusate sodium (COLACE) 100 MG capsule Take 100 mg by mouth daily.   Yes Historical Provider, MD  Lysine 1000 MG TABS Take 500 mg by mouth 2 (two) times daily.    Yes Historical Provider, MD  Misc Natural Products (TURMERIC CURCUMIN) CAPS Take 1 capsule by mouth 2 (two) times daily.   Yes Historical Provider, MD  Omega-3 Fatty Acids (FISH OIL) 1000 MG CAPS Take 1 capsule by mouth 2 (two) times daily.   Yes Historical Provider, MD  propranolol (INDERAL) 40 MG tablet Take 1 tablet (40 mg total) by mouth 2 (two) times daily. 11/16/15  Yes [name], MD  tobramycin (TOBREX) 0.3 % ophthalmic solution instill 1 drop into right eye four times a day BEGIN 1 DAY PRIOR ...  (REFER TO PRESCRIPTION NOTES). 12/17/15  Yes Historical Provider, MD  triamterene-hydrochlorothiazide (MAXZIDE-25) 37.5-25 MG tablet Take 1 tablet by mouth at bedtime. 09/06/15  Yes [name], MD    ROS:  Out of a complete 14 system review of symptoms, the patient complains only of the following symptoms, and all other reviewed systems are negative.  Ringing in the ears Frequent waking Bruising easily Numbness  Blood pressure 122/72, pulse 57, height 5\' 2"  (1.575 m), weight 133 lb 8 oz (60.555 kg).  Physical Exam  General: The patient is alert and cooperative at the time of the examination.  Skin: No significant peripheral edema is noted.   Neurologic Exam  Mental status: The patient is alert and oriented x 3 at the time of the examination. The patient has apparent normal recent and remote  memory, with an apparently normal attention span and concentration ability.   Cranial nerves: Facial symmetry is present. Speech is normal, no aphasia or dysarthria is noted. Extraocular movements are full. Visual fields are full.  Motor: The patient has good strength in all 4 extremities.  Sensory examination: Soft touch sensation is symmetric on the face, arms, and legs.  Coordination: The patient has good finger-nose-finger and heel-to-shin bilaterally.  Gait and station: The patient has a normal gait. Tandem gait is normal. Romberg is negative. No drift is seen.  Reflexes: Deep tendon reflexes are symmetric.   Assessment/Plan:  1. Migraine headache  2. Episodic right face numbness, speech disturbance  The patient is doing much better with her episodes of numbness and speech changes. The propranolol seems to have had a significant effect on reducing the frequency and severity of the events. These episodes likely are migrainous in nature. She will be maintained on the propranolol, she will stop the Plavix, she will follow up through this office in 6 months.   [name] MD 01/26/2016 7:50 PM  [facility] [address] [place], [place] [address]  Phone [phone] Fax [phone]

## 2016-04-03 ENCOUNTER — Other Ambulatory Visit: Payer: Self-pay

## 2016-04-03 MED ORDER — PROPRANOLOL HCL 40 MG PO TABS: 40.0000 mg | ORAL_TABLET | Freq: Two times a day (BID) | ORAL | Status: DC

## 2016-04-03 NOTE — Telephone Encounter (Signed)
Received faxed rx refill request from pharmacy. Retailed x 1 yr.

## 2016-05-03 ENCOUNTER — Other Ambulatory Visit (HOSPITAL_COMMUNITY): Payer: Self-pay | Admitting: *Deleted

## 2016-05-04 ENCOUNTER — Ambulatory Visit (HOSPITAL_COMMUNITY)
Admission: RE | Admit: 2016-05-04 | Discharge: 2016-05-04 | Disposition: A | Discharge status: Home / Self Care | Payer: Federal, State, Local not specified - PPO | Source: Ambulatory Visit | Attending: Internal Medicine | Admitting: Internal Medicine

## 2016-05-04 DIAGNOSIS — M810 Age-related osteoporosis without current pathological fracture: Secondary | ICD-10-CM | POA: Insufficient documentation

## 2016-05-04 MED ORDER — SODIUM CHLORIDE 0.9 % IV SOLN: Freq: Once | INTRAVENOUS | Status: DC

## 2016-05-04 MED ORDER — ZOLEDRONIC ACID 5 MG/100ML IV SOLN
5.0000 mg | Freq: Once | INTRAVENOUS | Status: AC
  Administered 2016-05-04: 5 mg via INTRAVENOUS

## 2016-05-04 MED ORDER — ZOLEDRONIC ACID 5 MG/100ML IV SOLN
INTRAVENOUS | Status: AC
  Filled 2016-05-04: qty 100

## 2016-07-19 ENCOUNTER — Encounter: Payer: Self-pay | Admitting: Internal Medicine

## 2016-07-26 ENCOUNTER — Encounter: Payer: Self-pay | Admitting: Internal Medicine

## 2016-07-26 ENCOUNTER — Encounter: Payer: Self-pay | Admitting: Nurse Practitioner

## 2016-07-26 ENCOUNTER — Ambulatory Visit (INDEPENDENT_AMBULATORY_CARE_PROVIDER_SITE_OTHER): Payer: Federal, State, Local not specified - PPO | Admitting: Nurse Practitioner

## 2016-07-26 VITALS — BP 117/76 | HR 67 | Ht 62.0 in | Wt 134.6 lb

## 2016-07-26 DIAGNOSIS — G43009 Migraine without aura, not intractable, without status migrainosus: Secondary | ICD-10-CM

## 2016-07-26 DIAGNOSIS — G43109 Migraine with aura, not intractable, without status migrainosus: Secondary | ICD-10-CM | POA: Diagnosis not present

## 2016-07-26 NOTE — Patient Instructions (Signed)
Continue Propranolol at current dose, does not need refills Continue Asa .81mg  Daily Call for increased episodes of numbness and speech changes Follow-up in 6-8 months  

## 2016-07-26 NOTE — Progress Notes (Signed)
[facility]  PATIENT: Sierra [name] DOB: 01-16-1941   REASON FOR VISIT: follow up for migraine HISTORY FROM: patient    HISTORY OF PRESENT ILLNESS:Update 07/26/16 CM Sierra [name] is a 75 year old female returns for follow-up with a history of migraine headaches and episodes of right facial numbness associated with slurred speech. CT angiogram of head and neck was unremarkable and EEG was normal her propanolol dose was increased to 40 mg twice daily and this increase has markedly improved her episodes. Weather changes can worsen her headaches however patient feels good at this time and she is tolerating the Inderal well. She remains on aspirin. She remains active. She returns for reevaluation  01/26/16 Sierra [name] is a 74 year old left-handed white female with a history of migraine headaches and episodes of right facial numbness associated with slurred speech. The patient has undergone a CT angiogram of the head and neck that was unremarkable, and an EEG evaluation was normal. The propranolol dose was increased to 40 mg twice daily, and this increase has markedly improved her episodes. She was having daily events, and now the events have essentially been eliminated, she may have on occasion a slight sensation alteration on the left lower face, but this is very mild. The patient still has some occasional headaches around the left eye that are her usual headache, and may be daily in nature usually in the morning lasting several days, then go away. Oftentimes weather changes worsen the headache. The patient feels much better at this time, she is tolerating the Inderal well. She remains on aspirin and Plavix.   REVIEW OF SYSTEMS: Full 14 system review of systems performed and notable only for those listed, all others are neg:  Constitutional: neg  Cardiovascular: neg Ear/Nose/Throat: neg  Skin: neg Eyes: neg Respiratory: neg Gastroitestinal: neg  Hematology/Lymphatic: neg    Endocrine: neg Musculoskeletal:neg Allergy/Immunology: neg Neurological: neg Psychiatric: neg Sleep : neg   ALLERGIES: No Known Allergies  HOME MEDICATIONS: Outpatient Medications Prior to Visit  Medication Sig Dispense Refill  . aspirin 81 MG tablet Take 81 mg by mouth daily.    Marland Kitchen atorvastatin (LIPITOR) 40 MG tablet Take 40 mg by mouth daily at 6 PM.     . Calcium Carb-Cholecalciferol (CALCIUM + D3) 600-200 MG-UNIT TABS Take 1 tablet by mouth 2 (two) times daily.    . Coenzyme Q10 (CO Q 10 PO) Take 1 capsule by mouth daily.    Marland Kitchen docusate sodium (COLACE) 100 MG capsule Take 100 mg by mouth daily.    Marland Kitchen Lysine 1000 MG TABS Take 500 mg by mouth 2 (two) times daily.     . Misc Natural Products (TURMERIC CURCUMIN) CAPS Take 1 capsule by mouth 2 (two) times daily.    . Omega-3 Fatty Acids (FISH OIL) 1000 MG CAPS Take 1 capsule by mouth 2 (two) times daily.    . propranolol (INDERAL) 40 MG tablet Take 1 tablet (40 mg total) by mouth 2 (two) times daily. 60 tablet 11  . tobramycin (TOBREX) 0.3 % ophthalmic solution instill 1 drop into right eye four times a day BEGIN 1 DAY PRIOR ...  (REFER TO PRESCRIPTION NOTES).  0  . triamterene-hydrochlorothiazide (MAXZIDE-25) 37.5-25 MG tablet Take 1 tablet by mouth at bedtime.     No facility-administered medications prior to visit.     PAST MEDICAL HISTORY: Past Medical History:  Diagnosis Date  . Chronic kidney disease    deformed left kidney- operated on, no troubles  . Common  migraine 10/21/2015  . Hypertension   . Macular degeneration    Eyelea injections every 16 weeks  . Osteoporosis     PAST SURGICAL HISTORY: Past Surgical History:  Procedure Laterality Date  . CATARACT EXTRACTION, BILATERAL Bilateral L 03-27-16  R 04-24-16  . COLONOSCOPY     screenin gonly benign polyps removed  . NEPHRECTOMY  1963   left  . TUBAL LIGATION  1975    FAMILY HISTORY: Family History  Problem Relation Age of Onset  . Thyroid disease Mother   .  Dementia Father   . Colon cancer Neg Hx   . Esophageal cancer Neg Hx   . Stomach cancer Neg Hx     SOCIAL HISTORY: Social History   Social History  . Marital status: Divorced    Spouse name: N/A  . Number of children: 2  . Years of education: 13   Occupational History  . [employer]     part time-8 hours a week   Social History Main Topics  . Smoking status: Never Smoker  . Smokeless tobacco: Never Used  . Alcohol use No  . Drug use: No  . Sexual activity: Not on file   Other Topics Concern  . Not on file   Social History Narrative   Lives at home w/ her daughter and grandson   Patient drinks about 2 cups of caffeine daily.   Patient is left handed.      PHYSICAL EXAM  Vitals:   07/26/16 1044  BP: 117/76  Pulse: 67  Weight: 134 lb 9.6 oz (61.1 kg)  Height: 5\' 2"  (1.575 m)   Body mass index is 24.62 kg/m.  Generalized: Well developed, in no acute distress  Head: normocephalic and atraumatic,. Oropharynx benign  Neck: Supple, no carotid bruits  Cardiac: Regular rate rhythm, no murmur  Musculoskeletal: No deformity   Neurological examination   Mentation: Alert oriented to time, place, history taking. Attention span and concentration appropriate. Recent and remote memory intact.  Follows all commands speech and language fluent.   Cranial nerve II-XII: Pupils were equal round reactive to light extraocular movements were full, visual field were full on confrontational test. Facial sensation and strength were normal. hearing was intact to finger rubbing bilaterally. Uvula tongue midline. head turning and shoulder shrug were normal and symmetric.Tongue protrusion into cheek strength was normal. Motor: normal bulk and tone, full strength in the BUE, BLE, fine finger movements normal, no pronator drift. No focal weakness Sensory: normal and symmetric to light touch, pinprick, and  Vibration, In the upper and lower extremities Coordination: finger-nose-finger,  heel-to-shin bilaterally, no dysmetria Reflexes: Brachioradialis 2/2, biceps 2/2, triceps 2/2, patellar 2/2, Achilles 2/2, plantar responses were flexor bilaterally. Gait and Station: Rising up from seated position without assistance, normal stance,  moderate stride, good arm swing, smooth turning, able to perform tiptoe, and heel walking without difficulty. Tandem gait is steady  DIAGNOSTIC DATA (LABS, IMAGING, TESTING) - I reviewed patient records, labs, notes, testing and imaging myself where available.  Lab Results  Component Value Date   WBC 6.1 09/01/2015   HGB 13.6 09/01/2015   HCT 40.0 09/01/2015   MCV 84.5 09/01/2015   PLT 231 09/01/2015      Component Value Date/Time   NA 140 09/01/2015 0710   K 3.8 09/01/2015 0710   CL 107 09/01/2015 0710   CO2 25 09/01/2015 0710   GLUCOSE 106 (H) 09/01/2015 0710   BUN 15 09/01/2015 0710   CREATININE 0.92 09/01/2015 0710  CALCIUM 9.4 09/01/2015 0710   GFRNONAA 60 (L) 09/01/2015 0710   GFRAA >60 09/01/2015 0710   Lab Results  Component Value Date   CHOL 190 09/01/2015   HDL 70 09/01/2015   LDLCALC 104 (H) 09/01/2015   TRIG 82 09/01/2015   CHOLHDL 2.7 09/01/2015   Lab Results  Component Value Date   HGBA1C 6.0 (H) 09/01/2015   No results found for: [id number] Lab Results  Component Value Date   TSH 3.519 09/01/2015      ASSESSMENT AND PLAN  75 y.o. year old female  has a past medical history of Chronic kidney disease; Common migraine (10/21/2015); Hypertension; Macular degeneration; and Osteoporosis. here To follow-up for migraine headaches and episodes of right facial numbness and speech disturbance The patient is a current patient of Dr.Willis  who is out of the office today . This note is sent to the work in doctor.     PLAN: Continue Propranolol at current dose, does not need refills Continue Asa .81mg   Daily Call for increased episodes of numbness and speech changes Follow-up in 6-8 months Sierra Bible,  [facility], [facility], [facility] [address] [place], [place] [address] [phone]

## 2016-07-28 NOTE — Progress Notes (Signed)
I have reviewed and agreed above plan. 

## 2016-09-13 ENCOUNTER — Ambulatory Visit (AMBULATORY_SURGERY_CENTER): Payer: Self-pay | Admitting: *Deleted

## 2016-09-13 VITALS — Ht 62.0 in | Wt 132.4 lb

## 2016-09-13 DIAGNOSIS — Z860100 Personal history of colon polyps, unspecified: Secondary | ICD-10-CM

## 2016-09-13 MED ORDER — NA SULFATE-K SULFATE-MG SULF 17.5-3.13-1.6 GM/177ML PO SOLN: ORAL | 0 refills | Status: DC

## 2016-09-13 NOTE — Progress Notes (Signed)
No egg or soy allergy  No intubation problems per pt  No diet medications taken  Pt did have post op nausea and dehydration with one surgery  Pay no more than $50 coupon given to pt  No home oxygen used or hx of sleep apnea

## 2016-09-27 ENCOUNTER — Ambulatory Visit (AMBULATORY_SURGERY_CENTER): Payer: Federal, State, Local not specified - PPO | Admitting: Internal Medicine

## 2016-09-27 ENCOUNTER — Encounter: Payer: Self-pay | Admitting: Internal Medicine

## 2016-09-27 VITALS — BP 113/59 | HR 64 | Temp 96.8°F | Resp 17 | Ht 62.0 in | Wt 132.0 lb

## 2016-09-27 DIAGNOSIS — Z860100 Personal history of colon polyps, unspecified: Secondary | ICD-10-CM

## 2016-09-27 MED ORDER — SODIUM CHLORIDE 0.9 % IV SOLN: 500.0000 mL | INTRAVENOUS | Status: DC

## 2016-09-27 NOTE — Op Note (Signed)
[facility] Patient Name: Sierra [name] Procedure Date: 09/27/2016 8:55 AM MRN: [id number] Endoscopist: Docia Chuck. Henrene Pastor , MD Age: 75 Referring MD:  Date of Birth: Jan 01, 1941 Gender: Female Account #: [id number] Procedure:                Colonoscopy Indications:              High risk colon cancer surveillance: Personal                            history of multiple (3 or more) adenomas. Prior                            examinations 2004, 2007, and 2012 with diminutive                            adenomas Medicines:                Monitored Anesthesia Care Procedure:                Pre-Anesthesia Assessment:                           - Prior to the procedure, a History and Physical                            was performed, and patient medications and                            allergies were reviewed. The patient's tolerance of                            previous anesthesia was also reviewed. The risks                            and benefits of the procedure and the sedation                            options and risks were discussed with the patient.                            All questions were answered, and informed consent                            was obtained. Prior Anticoagulants: The patient has                            taken no previous anticoagulant or antiplatelet                            agents. ASA Grade Assessment: II - A patient with                            mild systemic disease. After reviewing the risks  and benefits, the patient was deemed in                            satisfactory condition to undergo the procedure.                           After obtaining informed consent, the colonoscope                            was passed under direct vision. Throughout the                            procedure, the patient's blood pressure, pulse, and                            oxygen saturations were monitored continuously. The                        Model CF-HQ190L [id number]) scope was introduced                            through the anus and advanced to the the cecum,                            identified by appendiceal orifice and ileocecal                            valve. The ileocecal valve, appendiceal orifice,                            and rectum were photographed. The quality of the                            bowel preparation was good. The colonoscopy was                            performed without difficulty. The patient tolerated                            the procedure well. The bowel preparation used was                            SUPREP. Scope In: 9:04:44 AM Scope Out: 9:21:48 AM Scope Withdrawal Time: 0 hours 12 minutes 9 seconds  Total Procedure Duration: 0 hours 17 minutes 4 seconds  Findings:                 The entire examined colon appeared normal on direct                            and retroflexion views. Complications:            No immediate complications. Estimated blood loss:                            None. Estimated Blood  Loss:     Estimated blood loss: none. Impression:               - The entire examined colon is normal on direct and                            retroflexion views.                           - No specimens collected. Recommendation:           - Repeat colonoscopy is not recommended for                            surveillance based on today's favorable findings                            and your current age.                           - Patient has a contact number available for                            emergencies. The signs and symptoms of potential                            delayed complications were discussed with the                            patient. Return to normal activities tomorrow.                            Written discharge instructions were provided to the                            patient.                           - Resume previous diet.                            - Continue present medications. Docia Chuck. Henrene Pastor, MD 09/27/2016 9:26:18 AM This report has been signed electronically.

## 2016-09-27 NOTE — Patient Instructions (Signed)
YOU HAD AN ENDOSCOPIC PROCEDURE TODAY AT [facility]:   Refer to the procedure report that was given to you for any specific questions about what was found during the examination.  If the procedure report does not answer your questions, please call your gastroenterologist to clarify.  If you requested that your care partner not be given the details of your procedure findings, then the procedure report has been included in a sealed envelope for you to review at your convenience later.  YOU SHOULD EXPECT: Some feelings of bloating in the abdomen. Passage of more gas than usual.  Walking can help get rid of the air that was put into your GI tract during the procedure and reduce the bloating. If you had a lower endoscopy (such as a colonoscopy or flexible sigmoidoscopy) you may notice spotting of blood in your stool or on the toilet paper. If you underwent a bowel prep for your procedure, you may not have a normal bowel movement for a few days.  Please Note:  You might notice some irritation and congestion in your nose or some drainage.  This is from the oxygen used during your procedure.  There is no need for concern and it should clear up in a day or so.  SYMPTOMS TO REPORT IMMEDIATELY:   Following lower endoscopy (colonoscopy or flexible sigmoidoscopy):  Excessive amounts of blood in the stool  Significant tenderness or worsening of abdominal pains  Swelling of the abdomen that is new, acute  Fever of 100F or higher  For urgent or emergent issues, a gastroenterologist can be reached at any hour by calling [phone].  DIET:  We do recommend a small meal at first, but then you may proceed to your regular diet.  Drink plenty of fluids but you should avoid alcoholic beverages for 24 hours.  ACTIVITY:  You should plan to take it easy for the rest of today and you should NOT DRIVE or use heavy machinery until tomorrow (because of the sedation medicines used during the test).     FOLLOW UP: Our staff will call the number listed on your records the next business day following your procedure to check on you and address any questions or concerns that you may have regarding the information given to you following your procedure. If we do not reach you, we will leave a message.  However, if you are feeling well and you are not experiencing any problems, there is no need to return our call.  We will assume that you have returned to your regular daily activities without incident.  SIGNATURES/CONFIDENTIALITY: You and/or your care partner have signed paperwork which will be entered into your electronic medical record.  These signatures attest to the fact that that the information above on your After Visit Summary has been reviewed and is understood.  Full responsibility of the confidentiality of this discharge information lies with you and/or your care-partner.  Continue your normal medications  Please call Dr. Henrene Pastor if you have any concerns in the future

## 2016-09-27 NOTE — Progress Notes (Signed)
To recovery, report to [place], RN, VSS 

## 2016-09-28 ENCOUNTER — Telehealth: Payer: Self-pay

## 2016-09-28 NOTE — Telephone Encounter (Signed)
  Follow up Call-  Call back number 09/27/2016  Post procedure Call Back phone  # [phone]  Permission to leave phone message Yes  Some recent data might be hidden     Patient questions:  Do you have a fever, pain , or abdominal swelling? No. Pain Score  0 *  Have you tolerated food without any problems? Yes.    Have you been able to return to your normal activities? Yes.    Do you have any questions about your discharge instructions: Diet   No. Medications  No. Follow up visit  No.  Do you have questions or concerns about your Care? No.  Actions: * If pain score is 4 or above: No action needed, pain <4.

## 2016-10-22 IMAGING — CT CT HEAD W/O CM
2 series · 15 of 30 positions shown, 19 images · non-contrast
Comparison: None.

CLINICAL DATA: Acute onset of difficulty completing sentences, with
slurred speech and tongue tingling. Initial encounter.

EXAM:
CT HEAD WITHOUT CONTRAST
TECHNIQUE: Contiguous axial images were obtained from the base of the skull
through the vertex without intravenous contrast.

[Series 2: head w/o · axial · non-contrast · 0.45mm/px · z∈[-186,-66]mm · 13 of 30 slices shown, 17 images]
[im 3/30  brain]
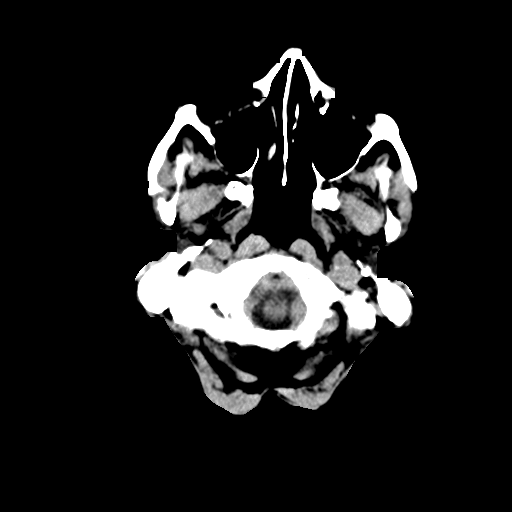
[im 3/30  bone]
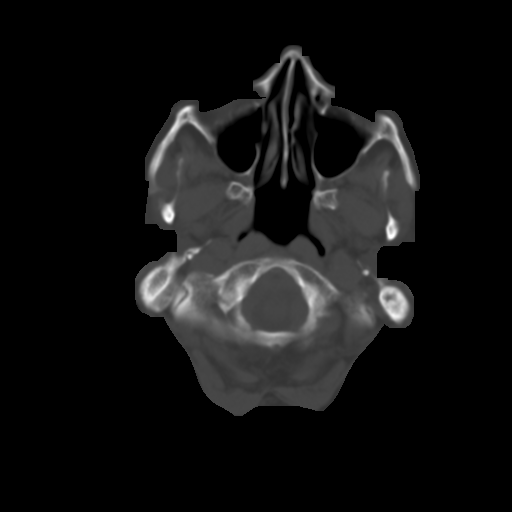
[im 5/30  brain]
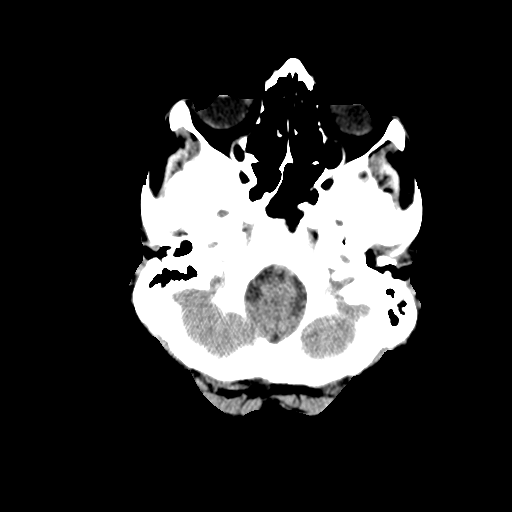
[im 7/30  brain]
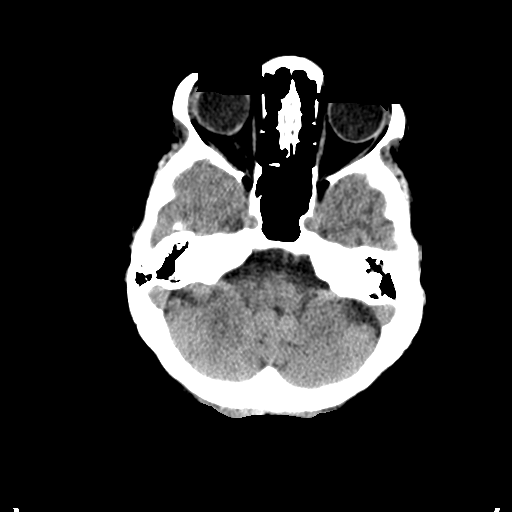
[im 9/30  brain]
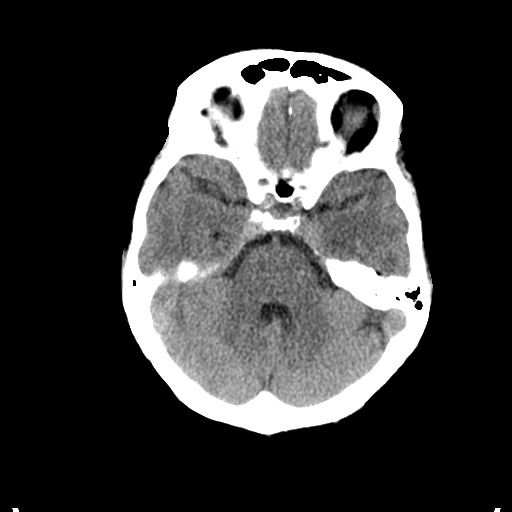
[im 11/30  brain]
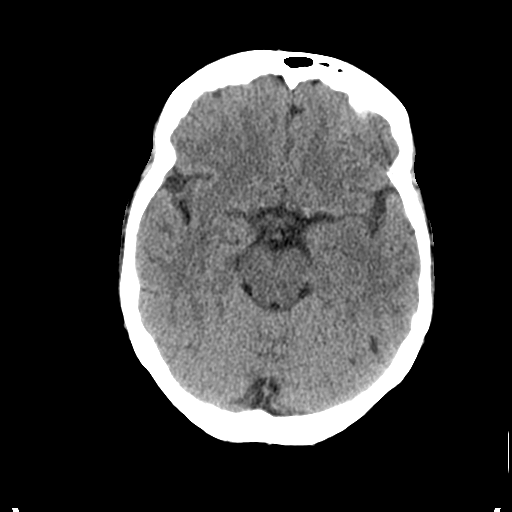
[im 11/30  bone]
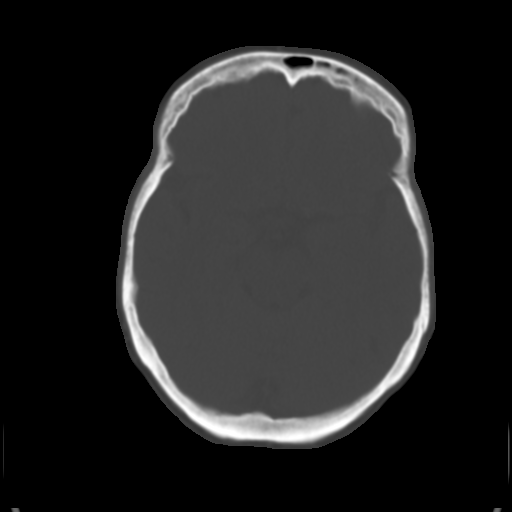
[im 13/30  brain]
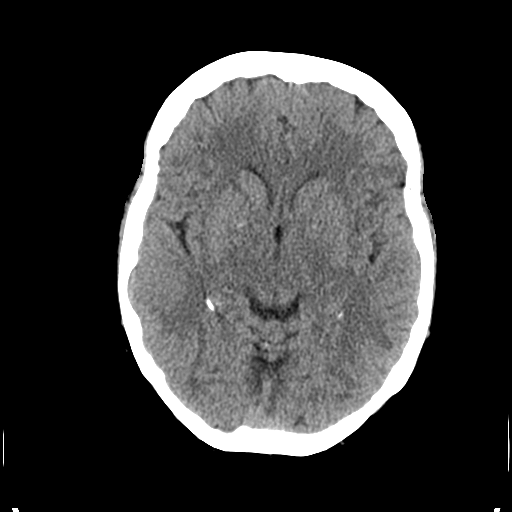
[im 15/30  brain]
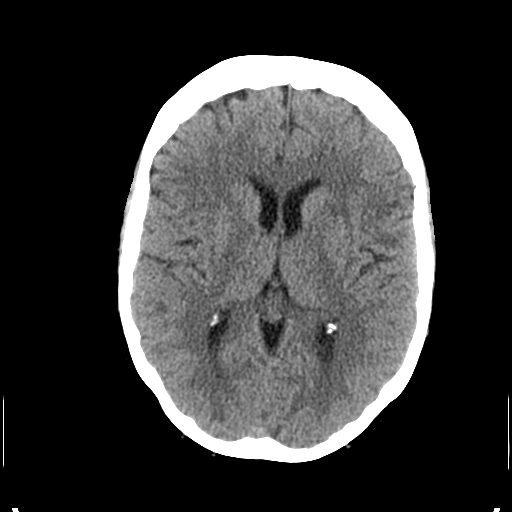
[im 17/30  brain]
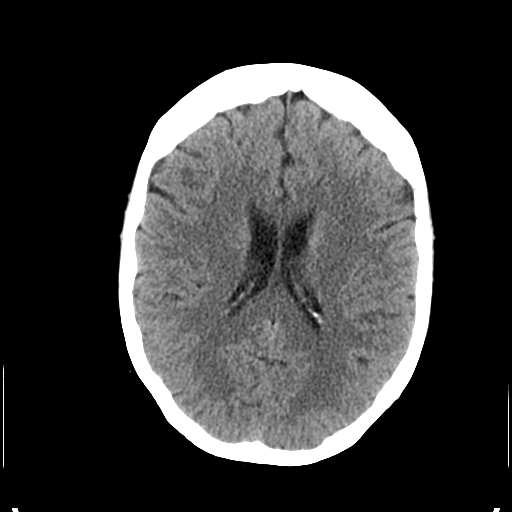
[im 19/30  brain]
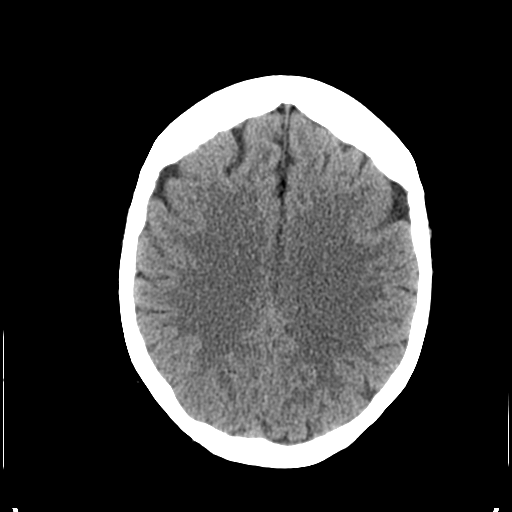
[im 19/30  bone]
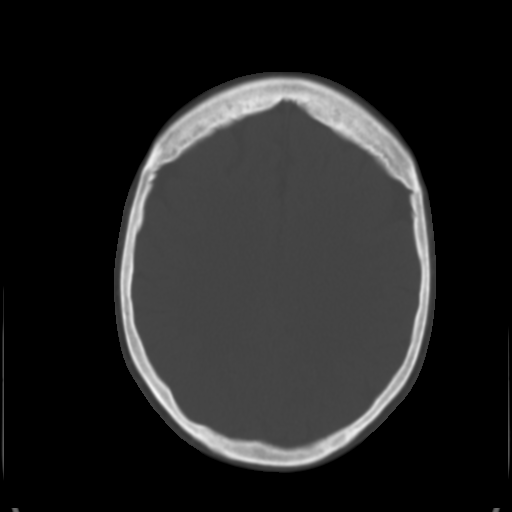
[im 21/30  brain]
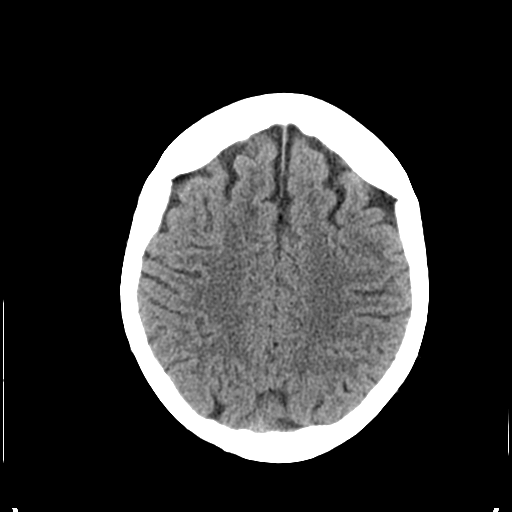
[im 23/30  brain]
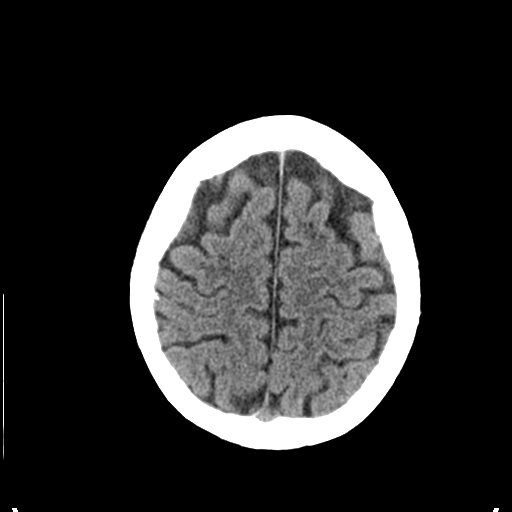
[im 25/30  brain]
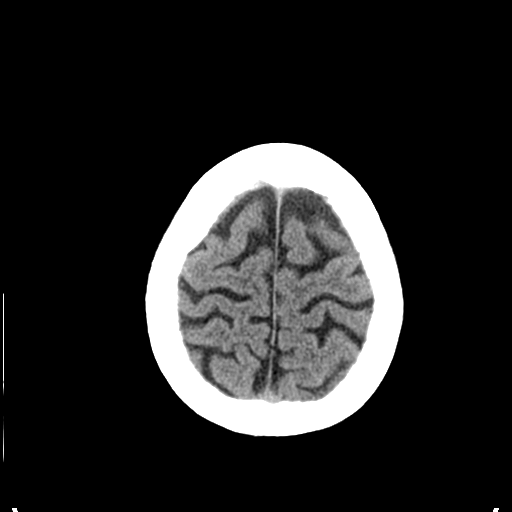
[im 27/30  brain]
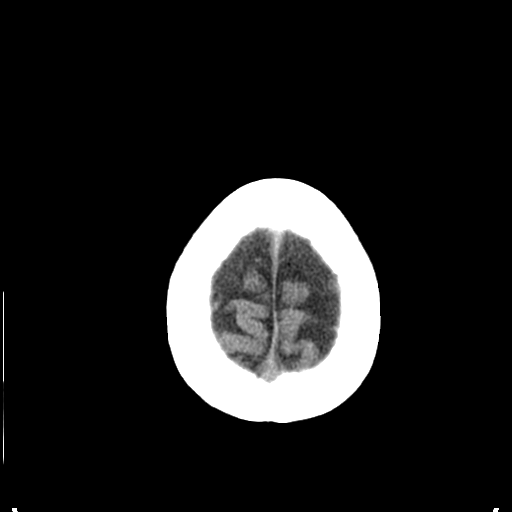
[im 27/30  bone]
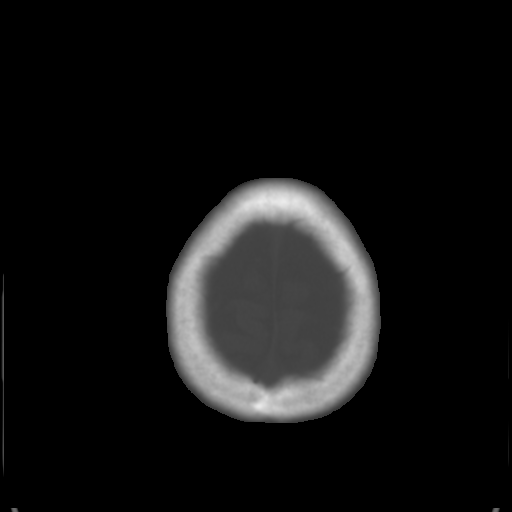

[Series 3: bone windows · axial · 0.45mm/px · z∈[-186,-166]mm · 2 of 30 slices shown]
[im 3/30  bone]
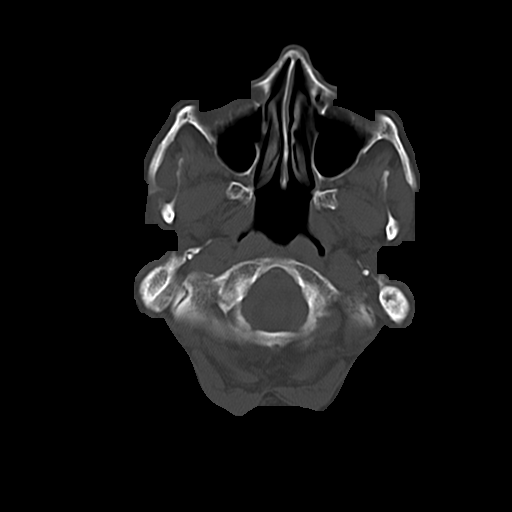
[im 7/30  bone]
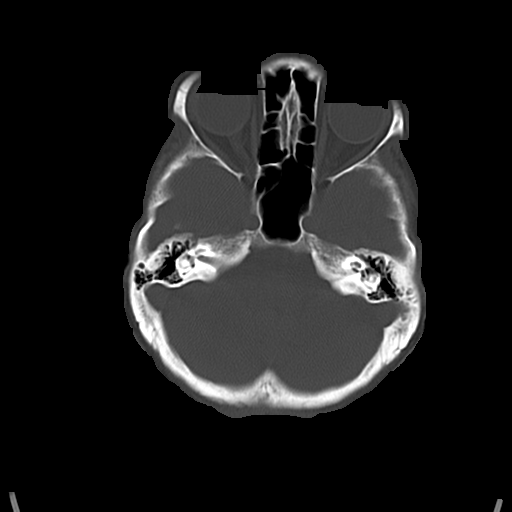

[15 of 30 positions shown; findings below may reference images not displayed]

FINDINGS: There is no evidence of acute infarction, mass lesion, or intra- or
extra-axial hemorrhage on CT.

Prominence of the sulci suggests mild cortical volume loss.

The brainstem and fourth ventricle are within normal limits. The
basal ganglia are unremarkable in appearance. The cerebral
hemispheres demonstrate grossly normal gray-white differentiation.
No mass effect or midline shift is seen.

There is no evidence of fracture; visualized osseous structures are
unremarkable in appearance. The orbits are within normal limits. The
paranasal sinuses and mastoid air cells are well-aerated. No
significant soft tissue abnormalities are seen.
IMPRESSION: 1. No acute intracranial pathology seen on CT.
2. Mild cortical volume loss noted.

## 2017-03-26 ENCOUNTER — Ambulatory Visit (INDEPENDENT_AMBULATORY_CARE_PROVIDER_SITE_OTHER): Payer: Federal, State, Local not specified - PPO | Admitting: Nurse Practitioner

## 2017-03-26 ENCOUNTER — Encounter: Payer: Self-pay | Admitting: Nurse Practitioner

## 2017-03-26 VITALS — BP 116/67 | HR 57 | Ht 62.0 in | Wt 130.6 lb

## 2017-03-26 DIAGNOSIS — G43109 Migraine with aura, not intractable, without status migrainosus: Secondary | ICD-10-CM

## 2017-03-26 MED ORDER — PROPRANOLOL HCL 40 MG PO TABS: 40.0000 mg | ORAL_TABLET | Freq: Two times a day (BID) | ORAL | 8 refills | Status: DC

## 2017-03-26 NOTE — Patient Instructions (Signed)
Continue Propranolol at current dose, does not need refills Continue Asa .81mg   Daily Call for increased episodes of numbness and speech changes Follow-up in 6-8 months

## 2017-03-26 NOTE — Progress Notes (Signed)
I have read the note, and I agree with the clinical assessment and plan.  Sierra [name],Sierra [name]   

## 2017-03-26 NOTE — Progress Notes (Signed)
[facility]  PATIENT: Sierra Sierra [name] DOB: [birth year]   REASON FOR VISIT: follow up for migraine HISTORY FROM: patient    HISTORY OF PRESENT ILLNESS:UPDATE 06/18/2018CM Sierra Sierra [name], 76 year old female returns for follow up with a history of right facial numbness associated with slurred speech of migraine headaches. EEG in the past was normal and CT angiogram of the head and neck was unremarkable. She has been doing well with her headaches. She is currently on Inderal 40 twice daily. She also remains on  Aspirin .81mg  daily she continues to be active. She was encouraged to drink plenty of fluids to prevent dehydration. She returns for reevaluation  Update 07/26/16 CM Sierra Sierra [name] is a 76 year old female returns for follow-up with a history of migraine headaches and episodes of right facial numbness associated with slurred speech. CT angiogram of head and neck was unremarkable and EEG was normal her propanolol dose was increased to 40 mg twice daily and this increase has markedly improved her episodes. Weather changes can worsen her headaches however patient feels good at this time and she is tolerating the Inderal well. She remains on aspirin. She remains active. She returns for reevaluation  01/26/16 KWMs. Sierra [name] is a 76 year old left-handed white female with a history of migraine headaches and episodes of right facial numbness associated with slurred speech. The patient has undergone a CT angiogram of the head and neck that was unremarkable, and an EEG evaluation was normal. The propranolol dose was increased to 40 mg twice daily, and this increase has markedly improved her episodes. She was having daily events, and now the events have essentially been eliminated, she may have on occasion a slight sensation alteration on the left lower face, but this is very mild. The patient still has some occasional headaches around the left eye that are her usual headache, and may be daily in  nature usually in the morning lasting several days, then go away. Oftentimes weather changes worsen the headache. The patient feels much better at this time, she is tolerating the Inderal well. She remains on aspirin and Plavix.   REVIEW OF SYSTEMS: Full 14 system review of systems performed and notable only for those listed, all others are neg:  Constitutional: neg  Cardiovascular: neg Ear/Nose/Throat: Ringing in the ears  Skin: neg Eyes: neg Respiratory: neg Gastroitestinal: neg  Hematology/Lymphatic: neg  Endocrine: neg Musculoskeletal:neg Allergy/Immunology: neg Neurological: Migraine headaches Psychiatric: neg Sleep : neg   ALLERGIES: No Known Allergies  HOME MEDICATIONS: Outpatient Medications Prior to Visit  Medication Sig Dispense Refill  . aspirin 81 MG tablet Take 81 mg by mouth daily.    Marland Kitchen atorvastatin (LIPITOR) 40 MG tablet Take 40 mg by mouth daily at 6 PM.     . Calcium Carb-Cholecalciferol (CALCIUM + D3) 600-200 MG-UNIT TABS Take 1 tablet by mouth 2 (two) times daily.    . Coenzyme Q10 (CO Q 10 PO) Take 1 capsule by mouth daily.    Marland Kitchen docusate sodium (COLACE) 100 MG capsule Take 100 mg by mouth daily.    Marland Kitchen Lysine 1000 MG TABS Take 500 mg by mouth 2 (two) times daily.     . Misc Natural Products (TURMERIC CURCUMIN) CAPS Take 1 capsule by mouth 2 (two) times daily.    . Omega-3 Fatty Acids (FISH OIL) 1000 MG CAPS Take 1 capsule by mouth 2 (two) times daily.    . propranolol (INDERAL) 40 MG tablet Take 1 tablet (40 mg total) by mouth 2 (two)  times daily. 60 tablet 11  . triamterene-hydrochlorothiazide (MAXZIDE-25) 37.5-25 MG tablet Take 1 tablet by mouth at bedtime.    . Zoledronic Acid (RECLAST IV) Inject into the vein. Takes yearly- last dose June 2017     Facility-Administered Medications Prior to Visit  Medication Dose Route Frequency Provider Last Rate Last Dose  . 0.9 %  sodium chloride infusion  500 mL Intravenous Continuous Irene Shipper, MD        PAST  MEDICAL HISTORY: Past Medical History:  Diagnosis Date  . Cataract   . Chronic kidney disease    deformed left kidney- removed at age 28  . 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 Hyperlipidemia   . Hypertension   . Macular degeneration    Eyelea injections every 16 weeks  . Osteoporosis    take Reclast yearly    PAST SURGICAL HISTORY: Past Surgical History:  Procedure Laterality Date  . CATARACT EXTRACTION, BILATERAL Bilateral L 03-27-16  R 04-24-16  . COLONOSCOPY     screenin gonly benign polyps removed  . NEPHRECTOMY  1963   left  . TUBAL LIGATION  1975    FAMILY HISTORY: Family History  Problem Relation Age of Onset  . Thyroid disease Mother   . Dementia Father   . Colon cancer Neg Hx   . Esophageal cancer Neg Hx   . Stomach cancer Neg Hx   . Rectal cancer Neg Hx     SOCIAL HISTORY: Social History   Social History  . Marital status: Divorced    Spouse name: N/A  . Number of children: 2  . Years of education: 13   Occupational History  . [employer]     part time-8 hours a week   Social History Main Topics  . Smoking status: Never Smoker  . Smokeless tobacco: Never Used  . Alcohol use No  . Drug use: No  . Sexual activity: Not on file   Other Topics Concern  . Not on file   Social History Narrative   Lives at home w/ her daughter and grandson   Patient drinks about 2 cups of caffeine daily.   Patient is left handed.      PHYSICAL EXAM  Vitals:   03/26/17 0926  BP: 116/67  Pulse: (!) 57  Weight: 130 lb 9.6 oz (59.2 kg)  Height: 5\' 2"  (1.575 m)   Body mass index is 23.89 kg/m.  Generalized: Well developed, in no acute distress  Head: normocephalic and atraumatic,. Oropharynx benign  Neck: Supple, no carotid bruits  Cardiac: Regular rate rhythm, no murmur  Musculoskeletal: No deformity   Neurological examination   Mentation: Alert oriented to time, place, history taking. Attention span and concentration appropriate. Recent and remote memory  intact.  Follows all commands speech and language fluent.   Cranial nerve II-XII: Pupils were equal round reactive to light extraocular movements were full, visual field were full on confrontational test. Facial sensation and strength were normal. hearing was intact to finger rubbing bilaterally. Uvula tongue midline. head turning and shoulder shrug were normal and symmetric.Tongue protrusion into cheek strength was normal. Motor: normal bulk and tone, full strength in the BUE, BLE, fine finger movements normal, no pronator drift. No focal weakness Sensory: normal and symmetric to light touch, pinprick, and  Vibration, In the upper and lower extremities Coordination: finger-nose-finger, heel-to-shin bilaterally, no dysmetria Reflexes: Symmetric upper and lower, plantar responses were flexor bilaterally. Gait and Station: Rising up from seated position without assistance, normal stance,  moderate stride, good arm swing, smooth turning, able to perform tiptoe, and heel walking without difficulty. Tandem gait is steady, no assistive device  DIAGNOSTIC DATA (LABS, IMAGING, TESTING) - I reviewed patient records, labs, notes, testing and imaging myself where available.  Lab Results  Component Value Date   WBC 6.1 09/01/2015   HGB 13.6 09/01/2015   HCT 40.0 09/01/2015   MCV 84.5 09/01/2015   PLT 231 09/01/2015      Component Value Date/Time   NA 140 09/01/2015 0710   K 3.8 09/01/2015 0710   CL 107 09/01/2015 0710   CO2 25 09/01/2015 0710   GLUCOSE 106 (H) 09/01/2015 0710   BUN 15 09/01/2015 0710   CREATININE 0.92 09/01/2015 0710   CALCIUM 9.4 09/01/2015 0710   GFRNONAA 60 (L) 09/01/2015 0710   GFRAA >60 09/01/2015 0710   Lab Results  Component Value Date   CHOL 190 09/01/2015   HDL 70 09/01/2015   LDLCALC 104 (H) 09/01/2015   TRIG 82 09/01/2015   CHOLHDL 2.7 09/01/2015   Lab Results  Component Value Date   HGBA1C 6.0 (H) 09/01/2015   No results found for: [id number] Lab Results   Component Value Date   TSH 3.519 09/01/2015      ASSESSMENT AND PLAN  76 y.o. year old female  has a past medical history of Cataract; Chronic kidney disease; Common migraine (10/21/2015); Hyperlipidemia; Hypertension; Macular degeneration; and Osteoporosis. here To follow-up for migraine headaches and episodes of right facial numbness and speech disturbance . Her symptoms are in good control.  PLAN: Continue Propranolol at current dose, will refill Continue Asa .81mg   Daily Call for increased episodes of numbness and speech changes Follow-up in 6-8 monthsif stable at that time will go to yearly f/u Dennie Bible, [facility], [facility], [facility] [address], [place], [place] [address] [phone]

## 2017-05-09 ENCOUNTER — Ambulatory Visit (HOSPITAL_COMMUNITY)
Admission: RE | Admit: 2017-05-09 | Discharge: 2017-05-09 | Disposition: A | Discharge status: Home / Self Care | Payer: Federal, State, Local not specified - PPO | Source: Ambulatory Visit | Attending: Internal Medicine | Admitting: Internal Medicine

## 2017-05-09 ENCOUNTER — Encounter (HOSPITAL_COMMUNITY): Payer: Self-pay

## 2017-05-09 DIAGNOSIS — M810 Age-related osteoporosis without current pathological fracture: Secondary | ICD-10-CM | POA: Diagnosis present

## 2017-05-09 MED ORDER — SODIUM CHLORIDE 0.9 % IV SOLN
Freq: Once | INTRAVENOUS | Status: AC
  Administered 2017-05-09: 11:00:00 via INTRAVENOUS

## 2017-05-09 MED ORDER — ZOLEDRONIC ACID 5 MG/100ML IV SOLN
5.0000 mg | Freq: Once | INTRAVENOUS | Status: AC
  Administered 2017-05-09: 5 mg via INTRAVENOUS
  Filled 2017-05-09: qty 100

## 2017-05-09 NOTE — Progress Notes (Signed)
Uneventful Reclast infusion.  Pt takes daily calcium/vitamin D, encouraged to keep taking unless MD instructs otherwise.  Pt was given d/c instructions on Reclast.  Pt was d/c ambulatory to lobby.

## 2017-05-09 NOTE — Discharge Instructions (Signed)

## 2017-09-27 ENCOUNTER — Ambulatory Visit (HOSPITAL_COMMUNITY)
Admission: RE | Admit: 2017-09-27 | Discharge: 2017-09-27 | Disposition: A | Discharge status: Home / Self Care | Payer: Federal, State, Local not specified - PPO | Source: Ambulatory Visit | Attending: Family | Admitting: Family

## 2017-09-27 ENCOUNTER — Other Ambulatory Visit (HOSPITAL_COMMUNITY): Payer: Self-pay | Admitting: Internal Medicine

## 2017-09-27 DIAGNOSIS — R52 Pain, unspecified: Secondary | ICD-10-CM | POA: Diagnosis not present

## 2017-09-27 DIAGNOSIS — I803 Phlebitis and thrombophlebitis of lower extremities, unspecified: Secondary | ICD-10-CM | POA: Insufficient documentation

## 2017-09-27 DIAGNOSIS — M79662 Pain in left lower leg: Secondary | ICD-10-CM | POA: Insufficient documentation

## 2017-11-26 ENCOUNTER — Ambulatory Visit: Payer: Federal, State, Local not specified - PPO | Admitting: Nurse Practitioner

## 2017-12-24 NOTE — Progress Notes (Signed)
[facility]  PATIENT: Sierra [name] DOB: [birth year]   REASON FOR VISIT: follow up for migraine HISTORY FROM: patient    HISTORY OF PRESENT ILLNESS:UPDATE 12/26/17 CM Sierra [name], 77 year old female returns for follow-up with a history of right facial numbness associated with slurring of her speech with migraine headaches.  She reports today that she has very rare numbness and few headaches ,she remains on propanolol 40 mg twice daily without side effects.  Recently had DVT left lower extremity and now on Eliquis aspirin discontinued.  She continues to be active.  She lives with an 70-month-old grandson.  She has no new neurologic complaints she returns for reevaluation.  UPDATE 06/18/2018CM Sierra [name], 77 year old female returns for follow up with a history of right facial numbness associated with slurred speech of migraine headaches. EEG in the past was normal and CT angiogram of the head and neck was unremarkable. She has been doing well with her headaches. She is currently on Inderal 40 twice daily. She also remains on  Aspirin .81mg  daily she continues to be active. She was encouraged to drink plenty of fluids to prevent dehydration. She returns for reevaluation  Update 07/26/16 CM Sierra [name] is a 77 year old female returns for follow-up with a history of migraine headaches and episodes of right facial numbness associated with slurred speech. CT angiogram of head and neck was unremarkable and EEG was normal her propanolol dose was increased to 40 mg twice daily and this increase has markedly improved her episodes. Weather changes can worsen her headaches however patient feels good at this time and she is tolerating the Inderal well. She remains on aspirin. She remains active. She returns for reevaluation  01/26/16 Sierra [name] is a 77 year old left-handed white female with a history of migraine headaches and episodes of right facial numbness associated with slurred speech.  The patient has undergone a CT angiogram of the head and neck that was unremarkable, and an EEG evaluation was normal. The propranolol dose was increased to 40 mg twice daily, and this increase has markedly improved her episodes. She was having daily events, and now the events have essentially been eliminated, she may have on occasion a slight sensation alteration on the left lower face, but this is very mild. The patient still has some occasional headaches around the left eye that are her usual headache, and may be daily in nature usually in the morning lasting several days, then go away. Oftentimes weather changes worsen the headache. The patient feels much better at this time, she is tolerating the Inderal well. She remains on aspirin and Plavix.   REVIEW OF SYSTEMS: Full 14 system review of systems performed and notable only for those listed, all others are neg:  Constitutional: neg  Cardiovascular: DVT LLE Ear/Nose/Throat: neg Skin: neg Eyes: neg Respiratory: neg Gastroitestinal: neg  Hematology/Lymphatic: neg  Endocrine: neg Musculoskeletal:neg Allergy/Immunology: neg Neurological: rare migraine headaches Psychiatric: neg Sleep : neg   ALLERGIES: No Known Allergies  HOME MEDICATIONS: Outpatient Medications Prior to Visit  Medication Sig Dispense Refill  . atorvastatin (LIPITOR) 40 MG tablet Take 40 mg by mouth daily at 6 PM.     . Calcium Carb-Cholecalciferol (CALCIUM + D3) 600-200 MG-UNIT TABS Take 1 tablet by mouth 2 (two) times daily.    . Coenzyme Q10 (CO Q 10 PO) Take 1 capsule by mouth daily.    Marland Kitchen docusate sodium (COLACE) 100 MG capsule Take 100 mg by mouth daily.    Marland Kitchen ELIQUIS  5 MG TABS tablet 5 mg 2 (two) times daily.  3  . Lysine 1000 MG TABS Take 500 mg by mouth 2 (two) times daily.     . Misc Natural Products (TURMERIC CURCUMIN) CAPS Take 1 capsule by mouth 2 (two) times daily.    . Omega-3 Fatty Acids (FISH OIL) 1000 MG CAPS Take 1 capsule by mouth 2 (two) times  daily.    . propranolol (INDERAL) 40 MG tablet Take 1 tablet (40 mg total) by mouth 2 (two) times daily. 60 tablet 8  . triamterene-hydrochlorothiazide (MAXZIDE-25) 37.5-25 MG tablet Take 1 tablet by mouth at bedtime.    . Zoledronic Acid (RECLAST IV) Inject into the vein. Takes yearly- last dose June 2017    . aspirin 81 MG tablet Take 81 mg by mouth daily.     Facility-Administered Medications Prior to Visit  Medication Dose Route Frequency Provider Last Rate Last Dose  . 0.9 %  sodium chloride infusion  500 mL Intravenous Continuous Irene Shipper, MD        PAST MEDICAL HISTORY: Past Medical History:  Diagnosis Date  . Cataract   . Chronic kidney disease    deformed left kidney- removed at age 72  . 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 Hyperlipidemia   . Hypertension   . Macular degeneration    Eyelea injections every 16 weeks  . Osteoporosis    take Reclast yearly    PAST SURGICAL HISTORY: Past Surgical History:  Procedure Laterality Date  . CATARACT EXTRACTION, BILATERAL Bilateral L 03-27-16  R 04-24-16  . COLONOSCOPY     screenin gonly benign polyps removed  . NEPHRECTOMY  1963   left  . TUBAL LIGATION  1975    FAMILY HISTORY: Family History  Problem Relation Age of Onset  . Thyroid disease Mother   . Dementia Father   . Colon cancer Neg Hx   . Esophageal cancer Neg Hx   . Stomach cancer Neg Hx   . Rectal cancer Neg Hx     SOCIAL HISTORY: Social History   Socioeconomic History  . Marital status: Divorced    Spouse name: Not on file  . Number of children: 2  . Years of education: 48  . Highest education level: Not on file  Social Needs  . Financial resource strain: Not on file  . Food insecurity - worry: Not on file  . Food insecurity - inability: Not on file  . Transportation needs - medical: Not on file  . Transportation needs - non-medical: Not on file  Occupational History  . 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 Smoking status: Never Smoker  . Smokeless tobacco: Never Used  Substance and Sexual Activity  . Alcohol use: No  . Drug use: No  . Sexual activity: Not on file  Other Topics Concern  . Not on file  Social History Narrative   Lives at home w/ her daughter and grandson   Patient drinks about 2 cups of caffeine daily.   Patient is left handed.      PHYSICAL EXAM  Vitals:   12/26/17 1427  BP: 124/71  Pulse: (!) 57   There is no height or weight on file to calculate BMI.  Generalized: Well developed, in no acute distress  Head: normocephalic and atraumatic,. Oropharynx benign  Neck: Supple, no carotid bruits  Cardiac: Regular rate rhythm, no murmur  Musculoskeletal: No deformity   Neurological  examination   Mentation: Alert oriented to time, place, history taking. Attention span and concentration appropriate. Recent and remote memory intact.  Follows all commands speech and language fluent.   Cranial nerve II-XII: Pupils were equal round reactive to light extraocular movements were full, visual field were full on confrontational test. Facial sensation and strength were normal. hearing was intact to finger rubbing bilaterally. Uvula tongue midline. head turning and shoulder shrug were normal and symmetric.Tongue protrusion into cheek strength was normal. Motor: normal bulk and tone, full strength in the BUE, BLE, fine finger movements normal, no pronator drift. No focal weakness Sensory: normal and symmetric to light touch, pinprick, and  Vibration, In the upper and lower extremities Coordination: finger-nose-finger, heel-to-shin bilaterally, no dysmetria Reflexes: Symmetric upper and lower, plantar responses were flexor bilaterally. Gait and Station: Rising up from seated position without assistance, normal stance,  moderate stride, good arm swing, smooth turning, able to perform tiptoe, and heel walking without difficulty. Tandem gait is steady, no assistive  device  DIAGNOSTIC DATA (LABS, IMAGING, TESTING) - I reviewed patient records, labs, notes, testing and imaging myself where available.  Lab Results  Component Value Date   WBC 6.1 09/01/2015   HGB 13.6 09/01/2015   HCT 40.0 09/01/2015   MCV 84.5 09/01/2015   PLT 231 09/01/2015      Component Value Date/Time   NA 140 09/01/2015 0710   K 3.8 09/01/2015 0710   CL 107 09/01/2015 0710   CO2 25 09/01/2015 0710   GLUCOSE 106 (H) 09/01/2015 0710   BUN 15 09/01/2015 0710   CREATININE 0.92 09/01/2015 0710   CALCIUM 9.4 09/01/2015 0710   GFRNONAA 60 (L) 09/01/2015 0710   GFRAA >60 09/01/2015 0710   Lab Results  Component Value Date   CHOL 190 09/01/2015   HDL 70 09/01/2015   LDLCALC 104 (H) 09/01/2015   TRIG 82 09/01/2015   CHOLHDL 2.7 09/01/2015   Lab Results  Component Value Date   HGBA1C 6.0 (H) 09/01/2015   No results found for: [id number] Lab Results  Component Value Date   TSH 3.519 09/01/2015      ASSESSMENT AND PLAN  77 y.o. year old female  has a past medical history of Cataract, Chronic kidney disease, Common migraine (10/21/2015), Hyperlipidemia, Hypertension, Macular degeneration, and Osteoporosis. here To follow-up for migraine headaches and episodes of right facial numbness and speech disturbance . Her symptoms are in good control.  PLAN: Continue Propranolol at current dose, will refill Continue Eliquis for DVT Call for increased episodes of numbness and speech changes Follow-up in 1 year Dennie Bible, [facility], [facility], [facility] [address] [place], [place] [address] [phone]

## 2017-12-26 ENCOUNTER — Encounter: Payer: Self-pay | Admitting: Nurse Practitioner

## 2017-12-26 ENCOUNTER — Ambulatory Visit: Payer: Federal, State, Local not specified - PPO | Admitting: Nurse Practitioner

## 2017-12-26 VITALS — BP 124/71 | HR 57

## 2017-12-26 DIAGNOSIS — G43109 Migraine with aura, not intractable, without status migrainosus: Secondary | ICD-10-CM

## 2017-12-26 DIAGNOSIS — G43009 Migraine without aura, not intractable, without status migrainosus: Secondary | ICD-10-CM

## 2017-12-26 MED ORDER — PROPRANOLOL HCL 40 MG PO TABS: 40.0000 mg | ORAL_TABLET | Freq: Two times a day (BID) | ORAL | 11 refills | Status: DC

## 2017-12-26 NOTE — Patient Instructions (Signed)
Continue Propranolol at current dose, will refill Continue Eliquis for DVT Call for increased episodes of numbness and speech changes Follow-up in 1 year

## 2018-05-16 ENCOUNTER — Ambulatory Visit (HOSPITAL_COMMUNITY)
Admission: RE | Admit: 2018-05-16 | Discharge: 2018-05-16 | Disposition: A | Discharge status: Home / Self Care | Payer: Federal, State, Local not specified - PPO | Source: Ambulatory Visit | Attending: Internal Medicine | Admitting: Internal Medicine

## 2018-05-16 ENCOUNTER — Encounter (HOSPITAL_COMMUNITY): Payer: Self-pay

## 2018-05-16 DIAGNOSIS — M810 Age-related osteoporosis without current pathological fracture: Secondary | ICD-10-CM | POA: Insufficient documentation

## 2018-05-16 MED ORDER — ZOLEDRONIC ACID 5 MG/100ML IV SOLN
5.0000 mg | Freq: Once | INTRAVENOUS | Status: AC
  Administered 2018-05-16: 5 mg via INTRAVENOUS
  Filled 2018-05-16: qty 100

## 2018-05-16 MED ORDER — SODIUM CHLORIDE 0.9 % IV SOLN
Freq: Once | INTRAVENOUS | Status: AC
  Administered 2018-05-16: 10:00:00 via INTRAVENOUS

## 2018-05-16 NOTE — Progress Notes (Signed)
Reclast given today.  Pt has had for several years now.  Pt refused information about Reclast, as she states she has info at home.  Uneventful infusion today.  Pt d/c ambulatory.

## 2018-05-16 NOTE — Discharge Instructions (Signed)

## 2018-12-30 ENCOUNTER — Ambulatory Visit: Payer: Federal, State, Local not specified - PPO | Admitting: Nurse Practitioner

## 2019-01-14 ENCOUNTER — Other Ambulatory Visit: Payer: Self-pay

## 2019-01-14 MED ORDER — PROPRANOLOL HCL 40 MG PO TABS: 40.0000 mg | ORAL_TABLET | Freq: Two times a day (BID) | ORAL | 3 refills | Status: DC

## 2019-05-14 ENCOUNTER — Other Ambulatory Visit (HOSPITAL_COMMUNITY): Payer: Self-pay | Admitting: *Deleted

## 2019-05-14 ENCOUNTER — Other Ambulatory Visit: Payer: Self-pay

## 2019-05-15 ENCOUNTER — Ambulatory Visit (HOSPITAL_COMMUNITY)
Admission: RE | Admit: 2019-05-15 | Discharge: 2019-05-15 | Disposition: A | Discharge status: Home / Self Care | Payer: Federal, State, Local not specified - PPO | Source: Ambulatory Visit | Attending: Internal Medicine | Admitting: Internal Medicine

## 2019-05-15 DIAGNOSIS — M810 Age-related osteoporosis without current pathological fracture: Secondary | ICD-10-CM | POA: Diagnosis present

## 2019-05-15 MED ORDER — ZOLEDRONIC ACID 5 MG/100ML IV SOLN
5.0000 mg | Freq: Once | INTRAVENOUS | Status: AC
  Administered 2019-05-15: 12:00:00 5 mg via INTRAVENOUS

## 2019-05-15 MED ORDER — ZOLEDRONIC ACID 5 MG/100ML IV SOLN
INTRAVENOUS | Status: AC
  Administered 2019-05-15: 12:00:00 5 mg via INTRAVENOUS
  Filled 2019-05-15: qty 100

## 2019-07-14 ENCOUNTER — Other Ambulatory Visit: Payer: Self-pay

## 2019-07-14 ENCOUNTER — Emergency Department (HOSPITAL_COMMUNITY)
Admission: EM | Admit: 2019-07-14 | Discharge: 2019-07-14 | Disposition: A | Discharge status: Home / Self Care | Payer: Federal, State, Local not specified - PPO | Attending: Emergency Medicine | Admitting: Emergency Medicine

## 2019-07-14 ENCOUNTER — Encounter (HOSPITAL_COMMUNITY): Payer: Self-pay

## 2019-07-14 ENCOUNTER — Emergency Department (HOSPITAL_COMMUNITY): Payer: Federal, State, Local not specified - PPO

## 2019-07-14 DIAGNOSIS — R519 Headache, unspecified: Secondary | ICD-10-CM | POA: Insufficient documentation

## 2019-07-14 DIAGNOSIS — Z8673 Personal history of transient ischemic attack (TIA), and cerebral infarction without residual deficits: Secondary | ICD-10-CM | POA: Diagnosis not present

## 2019-07-14 DIAGNOSIS — R531 Weakness: Secondary | ICD-10-CM | POA: Insufficient documentation

## 2019-07-14 DIAGNOSIS — U071 COVID-19: Secondary | ICD-10-CM | POA: Insufficient documentation

## 2019-07-14 DIAGNOSIS — N189 Chronic kidney disease, unspecified: Secondary | ICD-10-CM | POA: Diagnosis not present

## 2019-07-14 DIAGNOSIS — Z79899 Other long term (current) drug therapy: Secondary | ICD-10-CM | POA: Diagnosis not present

## 2019-07-14 DIAGNOSIS — R110 Nausea: Secondary | ICD-10-CM

## 2019-07-14 DIAGNOSIS — Z7982 Long term (current) use of aspirin: Secondary | ICD-10-CM | POA: Insufficient documentation

## 2019-07-14 DIAGNOSIS — E860 Dehydration: Secondary | ICD-10-CM | POA: Insufficient documentation

## 2019-07-14 DIAGNOSIS — I129 Hypertensive chronic kidney disease with stage 1 through stage 4 chronic kidney disease, or unspecified chronic kidney disease: Secondary | ICD-10-CM | POA: Diagnosis not present

## 2019-07-14 LAB — COMPREHENSIVE METABOLIC PANEL
ALT: 16 U/L (ref 0–44)
AST: 30 U/L (ref 15–41)
Albumin: 3.4 g/dL — ABNORMAL LOW (ref 3.5–5.0)
Alkaline Phosphatase: 54 U/L (ref 38–126)
Anion gap: 10 (ref 5–15)
BUN: 25 mg/dL — ABNORMAL HIGH (ref 8–23)
CO2: 23 mmol/L (ref 22–32)
Calcium: 8.9 mg/dL (ref 8.9–10.3)
Chloride: 100 mmol/L (ref 98–111)
Creatinine, Ser: 1.18 mg/dL — ABNORMAL HIGH (ref 0.44–1.00)
GFR calc Af Amer: 51 mL/min — ABNORMAL LOW (ref >60)
GFR calc non Af Amer: 44 mL/min — ABNORMAL LOW (ref >60)
Glucose, Bld: 116 mg/dL — ABNORMAL HIGH (ref 70–99)
Potassium: 3 mmol/L — ABNORMAL LOW (ref 3.5–5.1)
Sodium: 133 mmol/L — ABNORMAL LOW (ref 135–145)
Total Bilirubin: 1.1 mg/dL (ref 0.3–1.2)
Total Protein: 6.3 g/dL — ABNORMAL LOW (ref 6.5–8.1)

## 2019-07-14 LAB — BRAIN NATRIURETIC PEPTIDE: B Natriuretic Peptide: 41 pg/mL (ref 0.0–100.0)

## 2019-07-14 LAB — CBC WITH DIFFERENTIAL/PLATELET
Abs Immature Granulocytes: 0.02 10*3/uL (ref 0.00–0.07)
Basophils Absolute: 0 10*3/uL (ref 0.0–0.1)
Basophils Relative: 0 %
Eosinophils Absolute: 0 10*3/uL (ref 0.0–0.5)
Eosinophils Relative: 0 %
HCT: 38.3 % (ref 36.0–46.0)
Hemoglobin: 12.7 g/dL (ref 12.0–15.0)
Immature Granulocytes: 1 %
Lymphocytes Relative: 23 %
Lymphs Abs: 1 10*3/uL (ref 0.7–4.0)
MCH: 30 pg (ref 26.0–34.0)
MCHC: 33.2 g/dL (ref 30.0–36.0)
MCV: 90.3 fL (ref 80.0–100.0)
Monocytes Absolute: 0.3 10*3/uL (ref 0.1–1.0)
Monocytes Relative: 8 %
Neutro Abs: 2.9 10*3/uL (ref 1.7–7.7)
Neutrophils Relative %: 68 %
Platelets: 147 10*3/uL — ABNORMAL LOW (ref 150–400)
RBC: 4.24 MIL/uL (ref 3.87–5.11)
RDW: 12.8 % (ref 11.5–15.5)
WBC: 4.2 10*3/uL (ref 4.0–10.5)
nRBC: 0 % (ref 0.0–0.2)

## 2019-07-14 LAB — URINALYSIS, ROUTINE W REFLEX MICROSCOPIC
Bacteria, UA: NONE SEEN
Bilirubin Urine: NEGATIVE
Glucose, UA: NEGATIVE mg/dL
Hgb urine dipstick: NEGATIVE
Ketones, ur: NEGATIVE mg/dL
Nitrite: NEGATIVE
Protein, ur: NEGATIVE mg/dL
Specific Gravity, Urine: 1.008 (ref 1.005–1.030)
pH: 7 (ref 5.0–8.0)

## 2019-07-14 LAB — LACTIC ACID, PLASMA: Lactic Acid, Venous: 1.1 mmol/L (ref 0.5–1.9)

## 2019-07-14 LAB — TROPONIN I (HIGH SENSITIVITY): Troponin I (High Sensitivity): 7 ng/L (ref <18)

## 2019-07-14 LAB — SARS CORONAVIRUS 2 (TAT 6-24 HRS): SARS Coronavirus 2: POSITIVE — AB

## 2019-07-14 LAB — ETHANOL: Alcohol, Ethyl (B): <10 mg/dL (ref <10)

## 2019-07-14 LAB — LIPASE, BLOOD: Lipase: 44 U/L (ref 11–51)

## 2019-07-14 MED ORDER — SODIUM CHLORIDE 0.9 % IV BOLUS
1000.0000 mL | Freq: Once | INTRAVENOUS | Status: AC
  Administered 2019-07-14: 1000 mL via INTRAVENOUS

## 2019-07-14 MED ORDER — ONDANSETRON 4 MG PO TBDP: 4.0000 mg | ORAL_TABLET | Freq: Three times a day (TID) | ORAL | 0 refills | Status: DC | PRN

## 2019-07-14 MED ORDER — SODIUM CHLORIDE 0.9 % IV BOLUS
500.0000 mL | Freq: Once | INTRAVENOUS | Status: AC
  Administered 2019-07-14: 500 mL via INTRAVENOUS

## 2019-07-14 MED ORDER — ONDANSETRON HCL 4 MG/2ML IJ SOLN
4.0000 mg | Freq: Once | INTRAMUSCULAR | Status: AC
Start: 2019-07-14 — End: 2019-07-14
  Administered 2019-07-14: 14:00:00 4 mg via INTRAVENOUS
  Filled 2019-07-14: qty 2

## 2019-07-14 NOTE — Discharge Instructions (Signed)
As discussed, your evaluation today has been largely reassuring.  But, it is important that you monitor your condition carefully, and do not hesitate to return to the ED if you develop new, or concerning changes in your condition. ? ?Otherwise, please follow-up with your physician for appropriate ongoing care. ? ?

## 2019-07-14 NOTE — ED Provider Notes (Signed)
[facility] DEPT Provider Note   CSN: [id number] Arrival date & time: 07/14/19  [id number]     History   Chief Complaint Chief Complaint  Patient presents with  . Nausea    HPI Sierra [name] is a 78 y.o. female.     HPI Patient presents with concern of nausea, weakness. Onset was about 1 week ago, without clear precipitant. Since that time patient has had persistent nausea, diminished oral intake. No focal pain, there is mild generalized discomfort, with the weakness. Patient has a history of migraines, takes propanolol, but has had no recent change in his dosing. History is obtained by the patient and EMS providers. Reportedly, patient was hypotensive on arrival, unable to participate in orthostatic vital sign collection secondary to inability to stand upright without near syncope. Blood pressure reportedly 70 systolic initially. Patient received fluids, had increase in this value in a transport.   Past Medical History:  Diagnosis Date  . Cataract   . Chronic kidney disease    deformed left kidney- removed at age 82  . 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 Hyperlipidemia   . Hypertension   . Macular degeneration    Eyelea injections every 16 weeks  . Osteoporosis    take Reclast yearly    Patient Active Problem List   Diagnosis Date Noted  . 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 Migraine equivalent   . TIA (transient ischemic attack) 09/01/2015  . HTN (hypertension) 09/01/2015  . Hypokalemia 09/01/2015  . Macular degeneration 09/01/2015  . Hyperlipidemia 09/01/2015    Past Surgical History:  Procedure Laterality Date  . CATARACT EXTRACTION, BILATERAL Bilateral L 03-27-16  R 04-24-16  . COLONOSCOPY     screenin gonly benign polyps removed  . NEPHRECTOMY  1963   left  . TUBAL LIGATION  1975     OB History   No obstetric history on file.      Home Medications    Prior to Admission medications   Medication Sig Start Date End Date  Taking? Authorizing Provider  acetaminophen (TYLENOL) 500 MG tablet Take 500 mg by mouth every 6 (six) hours as needed for mild pain or headache.   Yes [provider]  aspirin 81 MG chewable tablet Chew 81 mg by mouth daily.   Yes [provider]  atorvastatin (LIPITOR) 40 MG tablet Take 40 mg by mouth daily at 6 PM.  07/01/11  Yes [provider]  Calcium Carb-Cholecalciferol (CALCIUM + D3) 600-200 MG-UNIT TABS Take 1 tablet by mouth 2 (two) times daily.   Yes [provider]  Coenzyme Q10 (CO Q 10 PO) Take 1 capsule by mouth daily.   Yes [provider]  docusate sodium (COLACE) 100 MG capsule Take 100 mg by mouth daily.   Yes [provider]  Lysine 1000 MG TABS Take 1,000 mg by mouth daily.    Yes [provider]  Misc Natural Products (TURMERIC CURCUMIN) CAPS Take 1 capsule by mouth daily.    Yes [provider]  Omega-3 Fatty Acids (FISH OIL) 1000 MG CAPS Take 1 capsule by mouth daily.    Yes [provider]  polyvinyl alcohol (LIQUIFILM TEARS) 1.4 % ophthalmic solution Place 1 drop into both eyes as needed for dry eyes.   Yes [provider]  propranolol (INDERAL) 40 MG tablet Take 1 tablet (40 mg total) by mouth 2 (two) times daily. 01/14/19  Yes [name], MD  SUMAtriptan (IMITREX) 6 MG/0.5ML SOLN injection Inject  6 mg into the skin every 2 (two) hours as needed for migraine or headache. May repeat in 2 hours if headache persists or recurs.   Yes [provider]  triamterene-hydrochlorothiazide (MAXZIDE-25) 37.5-25 MG tablet Take 1 tablet by mouth at bedtime. 09/06/15  Yes [name], MD  Zoledronic Acid (RECLAST IV) Inject into the vein. Takes yearly- last dose June 2017   Yes [provider]  ondansetron (ZOFRAN ODT) 4 MG disintegrating tablet Take 1 tablet (4 mg total) by mouth every 8 (eight) hours as needed for nausea or vomiting. 07/14/19   Carmin Muskrat, MD     Family History Family History  Problem Relation Age of Onset  . Thyroid disease Mother   . Dementia Father   . Colon cancer Neg Hx   . Esophageal cancer Neg Hx   . Stomach cancer Neg Hx   . Rectal cancer Neg Hx     Social History Social History   Tobacco Use  . Smoking status: Never Smoker  . Smokeless tobacco: Never Used  Substance Use Topics  . Alcohol use: No  . Drug use: No     Allergies   Sulfamethoxazole   Review of Systems Review of Systems  Constitutional:       Per HPI, otherwise negative  HENT:       Per HPI, otherwise negative  Respiratory:       Per HPI, otherwise negative  Cardiovascular:       Per HPI, otherwise negative  Gastrointestinal: Positive for nausea and vomiting (Retching, no substances produced.).  Endocrine:       Negative aside from HPI  Genitourinary:       Neg aside from HPI   Musculoskeletal:       Per HPI, otherwise negative  Skin: Negative.   Neurological: Positive for weakness and headaches. Negative for syncope.     Physical Exam Updated Vital Signs BP (!) 118/56 (BP Location: Left Arm)   Pulse 70   Temp 98.1 F (36.7 C) (Oral)   Resp 18   Ht 5\' 2"  (1.575 m)   Wt 59 kg   SpO2 96%   BMI 23.78 kg/m   Physical Exam Vitals signs and nursing note reviewed.  Constitutional:      General: She is not in acute distress.    Appearance: She is well-developed.  HENT:     Head: Normocephalic and atraumatic.  Eyes:     Conjunctiva/sclera: Conjunctivae normal.  Cardiovascular:     Rate and Rhythm: Normal rate and regular rhythm.  Pulmonary:     Effort: Pulmonary effort is normal. No respiratory distress.     Breath sounds: Normal breath sounds. No stridor.  Abdominal:     General: There is no distension.     Tenderness: There is no abdominal tenderness. There is no guarding.  Skin:    General: Skin is warm and dry.  Neurological:     Mental Status: She is alert and oriented to person, place, and time.     Cranial  Nerves: No cranial nerve deficit.      ED Treatments / Results  Labs (all labs ordered are listed, but only abnormal results are displayed) Labs Reviewed  COMPREHENSIVE METABOLIC PANEL - Abnormal; Notable for the following components:      Result Value   Sodium 133 (*)    Potassium 3.0 (*)    Glucose, Bld 116 (*)    BUN 25 (*)    Creatinine, Ser 1.18 (*)  Total Protein 6.3 (*)    Albumin 3.4 (*)    GFR calc non Af Amer 44 (*)    GFR calc Af Amer 51 (*)    All other components within normal limits  CBC WITH DIFFERENTIAL/PLATELET - Abnormal; Notable for the following components:   Platelets 147 (*)    All other components within normal limits  URINALYSIS, ROUTINE W REFLEX MICROSCOPIC - Abnormal; Notable for the following components:   Leukocytes,Ua TRACE (*)    All other components within normal limits  SARS CORONAVIRUS 2 (TAT 6-24 HRS)  ETHANOL  LIPASE, BLOOD  BRAIN NATRIURETIC PEPTIDE  LACTIC ACID, PLASMA  LACTIC ACID, PLASMA  TROPONIN I (HIGH SENSITIVITY)  TROPONIN I (HIGH SENSITIVITY)    EKG EKG Interpretation  Date/Time:  Monday July 14 2019 10:22:43 EDT Ventricular Rate:  66 PR Interval:    QRS Duration: 112 QT Interval:  429 QTC Calculation: 450 R Axis:   57 Text Interpretation:  Sinus rhythm Incomplete right bundle branch block T wave abnormality Artifact Abnormal ECG Confirmed by Carmin Muskrat [id number]) on 07/14/2019 11:38:16 AM   Radiology Dg Chest Port 1 View  Result Date: 07/14/2019 CLINICAL DATA:  Low grade fever, nausea, fatigue for 1 week EXAM: PORTABLE CHEST 1 VIEW COMPARISON:  None. FINDINGS: The heart size and mediastinal contours are within normal limits. Both lungs are clear. The visualized skeletal structures are unremarkable. IMPRESSION: No active disease. Electronically Signed   By: Kathreen Devoid   On: 07/14/2019 10:59    Procedures Procedures (including critical care time)  Medications Ordered in ED Medications  sodium chloride 0.9  % bolus 1,000 mL (0 mLs Intravenous Stopped 07/14/19 1228)  sodium chloride 0.9 % bolus 500 mL (500 mLs Intravenous New Bag/Given 07/14/19 1422)  ondansetron (ZOFRAN) injection 4 mg (4 mg Intravenous Given 07/14/19 1422)     Initial Impression / Assessment and Plan / ED Course  I have reviewed the triage vital signs and the nursing notes.  Pertinent labs & imaging results that were available during my care of the patient were reviewed by me and considered in my medical decision making (see chart for details).    Patient feeling better, blood pressure has improved, no vomiting.    4:34 PM Patient notes that she feels better she has been ambulatory, and on repeat evaluation of orthostatic vital signs, after fluid resuscitation she has no appreciable drop in her sitting, nor standing. With no ongoing nausea, no hemodynamic instability, reassuring labs, no evidence for pneumonia, no evidence of bacteremia, sepsis, and no complaints of pain, no indication for admission. Patient discharged with close outpatient follow-up, ongoing antiemetics, instructions on appropriate home resuscitation.  Final Clinical Impressions(s) / ED Diagnoses   Final diagnoses:  Nausea  Dehydration    ED Discharge Orders         Ordered    ondansetron (ZOFRAN ODT) 4 MG disintegrating tablet  Every 8 hours PRN     07/14/19 1634           Carmin Muskrat, MD 07/14/19 1645

## 2019-07-14 NOTE — ED Notes (Signed)
Patient ambulated to restroom to provide UA. No assistance needed with gate.

## 2019-07-14 NOTE — ED Triage Notes (Signed)
Pt c/o of nausea, weakness and fever x1 week.  Orthostatic changes on scene.  110/80 sitting, ems unable to ear BP standing, pt became pale and diaphoretic. Temp 98.2 62 HR CBG 136 104/66 BP 95% RA 20 Gauge L hand 4 mg zofran- resolved nausea 250cc NS

## 2019-07-15 ENCOUNTER — Ambulatory Visit: Payer: Federal, State, Local not specified - PPO | Admitting: Neurology

## 2019-07-31 ENCOUNTER — Ambulatory Visit: Payer: Federal, State, Local not specified - PPO | Admitting: Neurology

## 2019-08-01 ENCOUNTER — Other Ambulatory Visit: Payer: Self-pay

## 2019-08-01 DIAGNOSIS — Z20822 Contact with and (suspected) exposure to covid-19: Secondary | ICD-10-CM

## 2019-08-02 LAB — NOVEL CORONAVIRUS, NAA: SARS-CoV-2, NAA: NOT DETECTED

## 2019-10-27 NOTE — Progress Notes (Signed)
PATIENT: Sierra [name] DOB: 02-Feb-1941  REASON FOR VISIT: follow up HISTORY FROM: patient  HISTORY OF PRESENT ILLNESS: Today 10/28/19  Sierra [name] is a 79 year old female with history of right facial numbness associated with slurring of her speech and headaches.  She remains on propanolol 40 mg twice daily.  She has not been seen since March 2019. She indicates she is now off Eliquis, which she was on post DVT. Her headaches have been under good control. Within the last 1.5 years, she says she has had 2 migraines, at that time she goes to her primary doctor for an Imitrex injection. Within 15 minutes her headache will subside. She still may feel some facial numbness on the right, when she is doing her morning stretches. She is prone to have left occipital morning headaches, not every morning, related to if she sleeps too long. If she gets up and moves around they go away, often caused by sleeping too late. She reported several months ago sensation of numbness to the right ball of her foot, has almost improved completely, shortly after, developed on the left. Symptoms have nearly subsided. She lives with her daughter, drives a car without difficulty. Has not had any further episodes of slurred speech or numbness in her right arm. She may have a sensation of sharp pain to her right lateral upper arm with certain movements, not all the time, or daily. This happened to her maybe 18 years ago, she started working out and improved. She says she had to stop working out last year, thinks if she got back into gym exercise would improve. She has close follow-up with her primary doctor, Dr. Reynaldo Minium. She was in the emergency room in October for dehydration, tested positive for Covid. She presents today for evaluation unaccompanied.  HISTORY  HISTORY OF PRESENT ILLNESS:UPDATE 12/26/17 CM Sierra [name], 79 year old female returns for follow-up with a history of right facial numbness associated with slurring of her  speech with migraine headaches.  She reports today that she has very rare numbness and few headaches ,she remains on propanolol 40 mg twice daily without side effects.  Recently had DVT left lower extremity and now on Eliquis aspirin discontinued.  She continues to be active.  She lives with an 15-month-old grandson.  She has no new neurologic complaints she returns for reevaluation.  REVIEW OF SYSTEMS: Out of a complete 14 system review of symptoms, the patient complains only of the following symptoms, and all other reviewed systems are negative.  Headache, numbness  ALLERGIES: Allergies  Allergen Reactions  . Sulfamethoxazole Nausea Only    HOME MEDICATIONS: Outpatient Medications Prior to Visit  Medication Sig Dispense Refill  . acetaminophen (TYLENOL) 500 MG tablet Take 500 mg by mouth every 6 (six) hours as needed for mild pain or headache.    Marland Kitchen aspirin 81 MG chewable tablet Chew 81 mg by mouth daily.    Marland Kitchen atorvastatin (LIPITOR) 40 MG tablet Take 40 mg by mouth daily at 6 PM.     . Calcium Carb-Cholecalciferol (CALCIUM + D3) 600-200 MG-UNIT TABS Take 1 tablet by mouth 2 (two) times daily.    . Coenzyme Q10 (CO Q 10 PO) Take 1 capsule by mouth daily.    Marland Kitchen docusate sodium (COLACE) 100 MG capsule Take 100 mg by mouth daily.    Marland Kitchen Lysine 1000 MG TABS Take 1,000 mg by mouth daily.     . Misc Natural Products (TURMERIC CURCUMIN) CAPS Take 1 capsule by mouth daily.     Marland Kitchen  Omega-3 Fatty Acids (FISH OIL) 1000 MG CAPS Take 1 capsule by mouth daily.     . ondansetron (ZOFRAN ODT) 4 MG disintegrating tablet Take 1 tablet (4 mg total) by mouth every 8 (eight) hours as needed for nausea or vomiting. 20 tablet 0  . polyvinyl alcohol (LIQUIFILM TEARS) 1.4 % ophthalmic solution Place 1 drop into both eyes as needed for dry eyes.    . SUMAtriptan (IMITREX) 6 MG/0.5ML SOLN injection Inject 6 mg into the skin every 2 (two) hours as needed for migraine or headache. May repeat in 2 hours if headache persists  or recurs.    . triamterene-hydrochlorothiazide (MAXZIDE-25) 37.5-25 MG tablet Take 1 tablet by mouth at bedtime.    Marland Kitchen UNABLE TO FIND Take 1 tablet by mouth daily. Med Name: Prevagen    . Zoledronic Acid (RECLAST IV) Inject into the vein. Takes yearly- last dose June 2017    . propranolol (INDERAL) 40 MG tablet Take 1 tablet (40 mg total) by mouth 2 (two) times daily. 180 tablet 3   Facility-Administered Medications Prior to Visit  Medication Dose Route Frequency Provider Last Rate Last Admin  . 0.9 %  sodium chloride infusion  500 mL Intravenous Continuous Irene Shipper, MD        PAST MEDICAL HISTORY: Past Medical History:  Diagnosis Date  . Cataract   . Chronic kidney disease    deformed left kidney- removed at age 20  . 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 Hyperlipidemia   . Hypertension   . Macular degeneration    Eyelea injections every 16 weeks  . Osteoporosis    take Reclast yearly    PAST SURGICAL HISTORY: Past Surgical History:  Procedure Laterality Date  . CATARACT EXTRACTION, BILATERAL Bilateral L 03-27-16  R 04-24-16  . COLONOSCOPY     screenin gonly benign polyps removed  . NEPHRECTOMY  1963   left  . TUBAL LIGATION  1975    FAMILY HISTORY: Family History  Problem Relation Age of Onset  . Thyroid disease Mother   . Dementia Father   . Colon cancer Neg Hx   . Esophageal cancer Neg Hx   . Stomach cancer Neg Hx   . Rectal cancer Neg Hx     SOCIAL HISTORY: Social History   Socioeconomic History  . Marital status: Divorced    Spouse name: Not on file  . Number of children: 2  . Years of education: 60  . Highest education level: Not on file  Occupational History  . 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 Smoking status: Never Smoker  . Smokeless tobacco: Never Used  Substance and Sexual Activity  . Alcohol use: No  . Drug use: No  . Sexual activity: Not on file  Other Topics Concern  . Not on file  Social  History Narrative   Lives at home w/ her daughter and grandson   Patient drinks about 2 cups of caffeine daily.   Patient is left handed.    Social Determinants of Health   Financial Resource Strain:   . Difficulty of Paying Living Expenses: Not on file  Food Insecurity:   . Worried About Charity fundraiser in the Last Year: Not on file  . Ran Out of Food in the Last Year: Not on file  Transportation Needs:   . Lack of Transportation (Medical): Not on file  . Lack of Transportation (Non-Medical): Not on file  Physical Activity:   . Days of Exercise per Week: Not on file  . Minutes of Exercise per Session: Not on file  Stress:   . Feeling of Stress : Not on file  Social Connections:   . Frequency of Communication with Friends and Family: Not on file  . Frequency of Social Gatherings with Friends and Family: Not on file  . Attends Religious Services: Not on file  . Active Member of Clubs or Organizations: Not on file  . Attends Archivist Meetings: Not on file  . Marital Status: Not on file  Intimate Partner Violence:   . Fear of Current or Ex-Partner: Not on file  . Emotionally Abused: Not on file  . Physically Abused: Not on file  . Sexually Abused: Not on file   PHYSICAL EXAM  Vitals:   10/28/19 1429  BP: 128/76  Pulse: 66  Temp: (!) 97 F (36.1 C)  TempSrc: Oral  Weight: 135 lb 3.2 oz (61.3 kg)  Height: 5\' 2"  (1.575 m)   Body mass index is 24.73 kg/m.  Generalized: Well developed, in no acute distress   Neurological examination  Mentation: Alert oriented to time, place, history taking. Follows all commands speech and language fluent Cranial nerve II-XII: Pupils were equal round reactive to light. Extraocular movements were full, visual field were full on confrontational test. Facial sensation and strength were normal.  Head turning and shoulder shrug  were normal and symmetric. Motor: The motor testing reveals 5 over 5 strength of all 4 extremities.  Good symmetric motor tone is noted throughout.  Sensory: Sensory testing is intact to soft touch on all 4 extremities. No evidence of extinction is noted.  Coordination: Cerebellar testing reveals good finger-nose-finger and heel-to-shin bilaterally.  Gait and station: Gait is normal. Tandem gait is normal. Romberg is negative. No drift is seen.  Reflexes: Deep tendon reflexes are symmetric and normal bilaterally.   DIAGNOSTIC DATA (LABS, IMAGING, TESTING) - I reviewed patient records, labs, notes, testing and imaging myself where available.  Lab Results  Component Value Date   WBC 4.2 07/14/2019   HGB 12.7 07/14/2019   HCT 38.3 07/14/2019   MCV 90.3 07/14/2019   PLT 147 (L) 07/14/2019      Component Value Date/Time   NA 133 (L) 07/14/2019 1026   K 3.0 (L) 07/14/2019 1026   CL 100 07/14/2019 1026   CO2 23 07/14/2019 1026   GLUCOSE 116 (H) 07/14/2019 1026   BUN 25 (H) 07/14/2019 1026   CREATININE 1.18 (H) 07/14/2019 1026   CALCIUM 8.9 07/14/2019 1026   PROT 6.3 (L) 07/14/2019 1026   ALBUMIN 3.4 (L) 07/14/2019 1026   AST 30 07/14/2019 1026   ALT 16 07/14/2019 1026   ALKPHOS 54 07/14/2019 1026   BILITOT 1.1 07/14/2019 1026   GFRNONAA 44 (L) 07/14/2019 1026   GFRAA 51 (L) 07/14/2019 1026   Lab Results  Component Value Date   CHOL 190 09/01/2015   HDL 70 09/01/2015   LDLCALC 104 (H) 09/01/2015   TRIG 82 09/01/2015   CHOLHDL 2.7 09/01/2015   Lab Results  Component Value Date   HGBA1C 6.0 (H) 09/01/2015   No results found for: [id number] Lab Results  Component Value Date   TSH 3.519 09/01/2015   ASSESSMENT AND PLAN 79 y.o. year old female  has a past medical history of Cataract, Chronic kidney disease, Common migraine (10/21/2015), Hyperlipidemia, Hypertension, Macular degeneration, and Osteoporosis. here with:  1.  Chronic migraine headache, episodes of  right facial numbness and speech disturbance   She has not been seen in this office since March 2019.  She will  remain on propanolol 40 mg twice a day.  Her headaches remain under good control, has not had any episodes of facial numbness or speech disturbance associated with her headaches.  In the last year and a half she has had 2 migraines, where she goes to her PCP for imitrex injection with fast relief.  She overall appears to be doing very well. She does complain of some intermittent paresthesia symptoms in the balls of her feet, and her right upper arm, that she will monitor.  She will continue close follow-up with her primary doctor.  She will follow-up here in 1 year or sooner if needed (1 year was her requested follow-up).  I spent 15 minutes with the patient. 50% of this time was spent discussing her plan of care.  Butler Denmark, AGNP-C, DNP 10/28/2019, 3:39 PM [facility] [address] [place], [place] [address] [phone]

## 2019-10-28 ENCOUNTER — Encounter: Payer: Self-pay | Admitting: Neurology

## 2019-10-28 ENCOUNTER — Ambulatory Visit: Payer: Federal, State, Local not specified - PPO | Admitting: Neurology

## 2019-10-28 ENCOUNTER — Other Ambulatory Visit: Payer: Self-pay

## 2019-10-28 VITALS — BP 128/76 | HR 66 | Temp 97.0°F | Ht 62.0 in | Wt 135.2 lb

## 2019-10-28 DIAGNOSIS — G43109 Migraine with aura, not intractable, without status migrainosus: Secondary | ICD-10-CM

## 2019-10-28 MED ORDER — PROPRANOLOL HCL 40 MG PO TABS: 40.0000 mg | ORAL_TABLET | Freq: Two times a day (BID) | ORAL | 3 refills | Status: DC

## 2019-10-28 NOTE — Progress Notes (Signed)
I have read the note, and I agree with the clinical assessment and plan.  Shadoe Cryan K Orrin Yurkovich   

## 2019-10-28 NOTE — Patient Instructions (Addendum)
Continue current medications, continue stretching and exercise  See you in 1 year

## 2020-01-08 ENCOUNTER — Telehealth: Payer: Self-pay | Admitting: Neurology

## 2020-01-08 NOTE — Telephone Encounter (Signed)
Over the last 8 or 9 months, the patient has developed some numbness in the toes of one foot and and the other, the symptoms are persistent associated with an electric shock sensation and sharp pains at times.  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

## 2020-01-08 NOTE — Telephone Encounter (Signed)
I called pt and she is having issues she believes with the migraine equivalent , noted in last note. Please advise.

## 2020-01-08 NOTE — Telephone Encounter (Signed)
Patient called to report her symptoms are getting worst. States the ends of her nerves are acting up and she feels numbness on the bottom of her big toe and the toe beside it in both feet. Patient states it has been going on for about 9 months and thought it would go away as some symptoms come and go but it has not went away. Patient states when she gets upset it seems to aggravate it more. Also reports numbness in her chin.No soon apts with MD available until Sept.

## 2020-01-08 NOTE — Telephone Encounter (Signed)
I called Sierra [name] to schedule a 2 month follow up per Dr. Eugenie Birks note. Sierra [name] is schedule for June 2021.

## 2020-04-01 ENCOUNTER — Encounter: Payer: Self-pay | Admitting: Neurology

## 2020-04-01 ENCOUNTER — Ambulatory Visit: Payer: Federal, State, Local not specified - PPO | Admitting: Neurology

## 2020-04-01 ENCOUNTER — Other Ambulatory Visit: Payer: Self-pay

## 2020-04-01 VITALS — BP 126/78 | HR 60 | Ht 62.0 in | Wt 131.5 lb

## 2020-04-01 DIAGNOSIS — R202 Paresthesia of skin: Secondary | ICD-10-CM

## 2020-04-01 DIAGNOSIS — E538 Deficiency of other specified B group vitamins: Secondary | ICD-10-CM

## 2020-04-01 MED ORDER — PROPRANOLOL HCL ER 120 MG PO CP24: 120.0000 mg | ORAL_CAPSULE | Freq: Every day | ORAL | 1 refills | Status: DC

## 2020-04-01 NOTE — Progress Notes (Signed)
Reason for visit: Migraine, migraine equivalent  Sierra [name] is an 79 y.o. female  History of present illness:  Sierra [name] is a 79 year old left-handed white female with a history of migraine headaches associated with some migraine equivalent as well.  The patient has regular migraine headaches, but she does have frequent sensory events that are felt to be related to migraine equivalent.  She will have events that last about 15 minutes associated with numbness that begins around the right lip and then involves the right thumb and then gradually spreads from one finger to the next until involves all the fingers, then will resolve.  The patient may have occasional sensations on the left head with a tickling sensation that may spread down into the neck and shoulder again lasting about 15 minutes and then disappearing.  The patient may have 1 or 2 such episodes a week of some sensory alteration.  She in the past has had scintillating scotoma with her vision but this is a rare occurrence at this point.  She believes that the use of propranolol has reduced the frequency of the events but not eliminated them.  The patient comes in with some new issues as well.  Over the last 6 months or so she has noted some sensation of numbness in the feet with a leather sensation on the bottom of the feet mainly involving the ball of the feet.  She denies any back pain or pain down the legs on either side.  The patient believes that the sensation in the feet may come and go some.  She has not noted any significant change in balance, she may occasionally have a lancinating pain.  She has also noted some dull achy pain in the upper lateral arm on the right going up to the shoulder but not into the neck.  She may have pain if she lays on her side on the right at night.  There is no particular movement of the arm that brings on the pain.  She comes to the office today for further evaluation.  She reports no significant  balance changes or weakness.  Past Medical History:  Diagnosis Date  . Cataract   . Chronic kidney disease    deformed left kidney- removed at age 25  . 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 Hyperlipidemia   . Hypertension   . Macular degeneration    Eyelea injections every 16 weeks  . Osteoporosis    take Reclast yearly    Past Surgical History:  Procedure Laterality Date  . CATARACT EXTRACTION, BILATERAL Bilateral L 03-27-16  R 04-24-16  . COLONOSCOPY     screenin gonly benign polyps removed  . NEPHRECTOMY  1963   left  . TUBAL LIGATION  1975    Family History  Problem Relation Age of Onset  . Thyroid disease Mother   . Dementia Father   . Colon cancer Neg Hx   . Esophageal cancer Neg Hx   . Stomach cancer Neg Hx   . Rectal cancer Neg Hx     Social history:  reports that she has never smoked. She has never used smokeless tobacco. She reports that she does not drink alcohol and does not use drugs.    Allergies  Allergen Reactions  . Sulfamethoxazole Nausea Only    Medications:  Prior to Admission medications   Medication Sig Start Date End Date Taking? Authorizing Provider  acetaminophen (TYLENOL) 500 MG tablet Take 500 mg by mouth every 6 (  six) hours as needed for mild pain or headache.   Yes [provider]  aspirin 81 MG chewable tablet Chew 81 mg by mouth daily.   Yes [provider]  atorvastatin (LIPITOR) 40 MG tablet Take 40 mg by mouth daily at 6 PM.  07/01/11  Yes [provider]  Calcium Carb-Cholecalciferol (CALCIUM + D3) 600-200 MG-UNIT TABS Take 1 tablet by mouth 2 (two) times daily.   Yes [provider]  Coenzyme Q10 (CO Q 10 PO) Take 1 capsule by mouth daily.   Yes [provider]  docusate sodium (COLACE) 100 MG capsule Take 100 mg by mouth daily.   Yes [provider]  Lysine 1000 MG TABS Take 1,000 mg by mouth daily.    Yes [provider]  Misc Natural Products (TURMERIC CURCUMIN) CAPS Take 1  capsule by mouth daily.    Yes [provider]  Omega-3 Fatty Acids (FISH OIL) 1000 MG CAPS Take 1 capsule by mouth daily.    Yes [provider]  polyvinyl alcohol (LIQUIFILM TEARS) 1.4 % ophthalmic solution Place 1 drop into both eyes as needed for dry eyes.   Yes [provider]  propranolol (INDERAL) 40 MG tablet Take 1 tablet (40 mg total) by mouth 2 (two) times daily. 10/28/19  Yes Suzzanne Cloud, NP  SUMAtriptan (IMITREX) 6 MG/0.5ML SOLN injection Inject 6 mg into the skin every 2 (two) hours as needed for migraine or headache. May repeat in 2 hours if headache persists or recurs.   Yes [provider]  triamterene-hydrochlorothiazide (MAXZIDE-25) 37.5-25 MG tablet Take 1 tablet by mouth at bedtime. 09/06/15  Yes [name], MD  Turmeric (QC TUMERIC COMPLEX) 500 MG CAPS Take by mouth.   Yes [provider]  UNABLE TO FIND Take 1 tablet by mouth daily. Med Name: Prevagen   Yes [provider]  Zoledronic Acid (RECLAST IV) Inject into the vein. Takes yearly- last dose June 2017   Yes [provider]    ROS:  Out of a complete 14 system review of symptoms, the patient complains only of the following symptoms, and all other reviewed systems are negative.  Sensation changes Right arm pain Headache  Blood pressure 126/78, pulse 60, height 5\' 2"  (1.575 m), weight 131 lb 8 oz (59.6 kg).  Physical Exam  General: The patient is alert and cooperative at the time of the examination.  Neuromuscular: Passive range of movement of the right shoulder does not elicit pain.  Skin: No significant peripheral edema is noted.   Neurologic Exam  Mental status: The patient is alert and oriented x 3 at the time of the examination. The patient has apparent normal recent and remote memory, with an apparently normal attention span and concentration ability.   Cranial nerves: Facial symmetry is present. Speech is normal, no aphasia  or dysarthria is noted. Extraocular movements are full. Visual fields are full.  Motor: The patient has good strength in all 4 extremities.  Sensory examination: Soft touch sensation is symmetric on the face, arms, and legs.  There is no evidence of a deficit to pinprick sensation or vibration sensation on all fours.  There is no evidence of a stocking pattern pinprick sensory deficit.  Coordination: The patient has good finger-nose-finger and heel-to-shin bilaterally.  Gait and station: The patient has a normal gait. Tandem gait is normal. Romberg is negative. No drift is seen.  Reflexes: Deep tendon reflexes are symmetric, there may be some depression  of ankle jerk reflexes bilaterally.   Assessment/Plan:  1.  Migraine headache, migraine equivalent  2.  Left upper arm discomfort, questionable etiology  3.  Bilateral foot numbness, possible peripheral neuropathy  The patient will be set up for blood work today.  We will conservatively follow the mild sensory changes in the feet, if this worsens over time I will check nerve conduction study and EMG evaluation.  She will follow-up here in about 6 months.  The propranolol dose will be increased to 120 mg LA capsule, 1 daily.  [name] MD 04/01/2020 11:52 AM  [facility] [address] [place], [place] [address]  Phone [phone] Fax [phone]

## 2020-04-05 ENCOUNTER — Telehealth: Payer: Self-pay | Admitting: Neurology

## 2020-04-05 DIAGNOSIS — E538 Deficiency of other specified B group vitamins: Secondary | ICD-10-CM

## 2020-04-05 LAB — MULTIPLE MYELOMA PANEL, SERUM
Albumin SerPl Elph-Mcnc: 4 g/dL (ref 2.9–4.4)
Albumin/Glob SerPl: 1.5 (ref 0.7–1.7)
Alpha 1: 0.2 g/dL (ref 0.0–0.4)
Alpha2 Glob SerPl Elph-Mcnc: 0.8 g/dL (ref 0.4–1.0)
B-Globulin SerPl Elph-Mcnc: 0.9 g/dL (ref 0.7–1.3)
Gamma Glob SerPl Elph-Mcnc: 0.9 g/dL (ref 0.4–1.8)
Globulin, Total: 2.8 g/dL (ref 2.2–3.9)
IgA/Immunoglobulin A, Serum: 172 mg/dL (ref 64–422)
IgG (Immunoglobin G), Serum: 924 mg/dL (ref 586–1602)
IgM (Immunoglobulin M), Srm: 14 mg/dL — ABNORMAL LOW (ref 26–217)
Total Protein: 6.8 g/dL (ref 6.0–8.5)

## 2020-04-05 LAB — SEDIMENTATION RATE: Sed Rate: 2 mm/hr (ref 0–40)

## 2020-04-05 LAB — COPPER, SERUM: Copper: 102 ug/dL (ref 80–158)

## 2020-04-05 LAB — B. BURGDORFI ANTIBODIES: Lyme IgG/IgM Ab: <0.91 {ISR} (ref 0.00–0.90)

## 2020-04-05 LAB — RHEUMATOID FACTOR: Rheumatoid fact SerPl-aCnc: <10 IU/mL (ref 0.0–13.9)

## 2020-04-05 LAB — ANA W/REFLEX: Anti Nuclear Antibody (ANA): NEGATIVE

## 2020-04-05 LAB — ANGIOTENSIN CONVERTING ENZYME: Angio Convert Enzyme: 54 U/L (ref 14–82)

## 2020-04-05 LAB — VITAMIN B12: Vitamin B-12: 149 pg/mL — ABNORMAL LOW (ref 232–1245)

## 2020-04-05 MED ORDER — CYANOCOBALAMIN 1000 MCG/ML IJ SOLN
1000.0000 ug | Freq: Once | INTRAMUSCULAR | Status: AC
  Administered 2020-04-06: 1000 ug via INTRAMUSCULAR

## 2020-04-05 NOTE — Telephone Encounter (Signed)
Sierra [name] patient can come in 06/292021 for a B-12 injection . Around 11-12 and I relayed to her to pick up 1000 mcg by mouth  of B-12 to start today . Sierra [name] she said if she does not pick up you can call her with the time you want her to come in for B -12 injection . Thanks [name].

## 2020-04-05 NOTE — Telephone Encounter (Signed)
I called pt. I discussed Dr. Jannifer Franklin' recommendations. She is agreeable to coming tomorrow morning around 11-12 for a vit b12 injection. She will start vit b12 1031mcg PO daily after that. Pt verbalized understanding of recommendations. Pt had no questions at this time but was encouraged to call back if questions arise.

## 2020-04-05 NOTE — Telephone Encounter (Signed)
Patient called stating her B12 came back low and she does not know what to do or how to take the medicine.

## 2020-04-05 NOTE — Addendum Note (Signed)
Addended by: Lester Waterville A on: 04/05/2020 01:06 PM   Modules accepted: Orders

## 2020-04-05 NOTE — Telephone Encounter (Signed)
I called pt. She has pending labs but her vit b12 did come back low at 149. I advised her that Dr. Jannifer Franklin is waiting on the pending labs to result but that we would call her with those results and Dr. Jannifer Franklin' recommendations regarding the low B12 as soon as we could. Pt is aware that Dr. Jannifer Franklin is out of the office this week.

## 2020-04-05 NOTE — Telephone Encounter (Signed)
Received this note from Dr. Jannifer Franklin: "I am aware that the B12 level is low, I was going to wait until I got everything and to call her, but it is okay if you want to call her now and have her come in for B12 injection to tell her to then start taking 1000 mcg daily orally of vitamin B12."  I called pt to discuss. No answer, left a message asking her to call me back.

## 2020-04-06 ENCOUNTER — Other Ambulatory Visit: Payer: Self-pay

## 2020-04-06 ENCOUNTER — Ambulatory Visit (INDEPENDENT_AMBULATORY_CARE_PROVIDER_SITE_OTHER): Payer: Federal, State, Local not specified - PPO

## 2020-04-06 DIAGNOSIS — E538 Deficiency of other specified B group vitamins: Secondary | ICD-10-CM | POA: Diagnosis not present

## 2020-04-06 NOTE — Telephone Encounter (Signed)
PT was given b12 injection today and was educated on taking b12 1048mcg daily. Pt had brought the b12 1025mcg and will start taking them daily. She verbalized understanding.

## 2020-04-06 NOTE — Progress Notes (Signed)
Pt here for B 12 injection.  Under aseptic technique cyanocobalamin 1000mcg/1ml IM given left deltoid..Tolerated well.  Bandaid applied 

## 2020-04-28 ENCOUNTER — Other Ambulatory Visit (HOSPITAL_COMMUNITY): Payer: Self-pay | Admitting: *Deleted

## 2020-04-29 ENCOUNTER — Ambulatory Visit (HOSPITAL_COMMUNITY)
Admission: RE | Admit: 2020-04-29 | Discharge: 2020-04-29 | Disposition: A | Discharge status: Home / Self Care | Payer: Federal, State, Local not specified - PPO | Source: Ambulatory Visit | Attending: Internal Medicine | Admitting: Internal Medicine

## 2020-04-29 ENCOUNTER — Other Ambulatory Visit: Payer: Self-pay

## 2020-04-29 DIAGNOSIS — M810 Age-related osteoporosis without current pathological fracture: Secondary | ICD-10-CM | POA: Insufficient documentation

## 2020-04-29 MED ORDER — ZOLEDRONIC ACID 5 MG/100ML IV SOLN
INTRAVENOUS | Status: AC
  Administered 2020-04-29: 5 mg via INTRAVENOUS
  Filled 2020-04-29: qty 100

## 2020-04-29 MED ORDER — ZOLEDRONIC ACID 5 MG/100ML IV SOLN: 5.0000 mg | Freq: Once | INTRAVENOUS | Status: AC

## 2020-07-14 ENCOUNTER — Other Ambulatory Visit: Payer: Self-pay

## 2020-08-09 ENCOUNTER — Ambulatory Visit: Payer: Self-pay | Admitting: Surgery

## 2020-08-09 DIAGNOSIS — N6091 Unspecified benign mammary dysplasia of right breast: Secondary | ICD-10-CM

## 2020-08-10 ENCOUNTER — Ambulatory Visit: Payer: Self-pay | Admitting: Surgery

## 2020-08-10 NOTE — H&P (Signed)
History of Present Illness Sierra [name]. Sierra Baldonado MD; 08/10/2020 2:31 PM) The patient is a 79 year old female who presents with a breast mass. Referred by Dr. Reynaldo Minium for right breast mass  Mammograms were performed at [facility].  This is a 79 year old female in relatively good health who presents after recent right mammogram. This showed an area of distortion in the right upper outer quadrant measuring 3 mm across. This is located 7 cm from the nipple. Subsequently she underwent biopsy of this area. Biopsy confirmed atypical lobular hyperplasia with microcalcifications. The patient is now referred to [place] to discuss excision. The patient has a family history of living into the upper 90s or early 4s. She is quite active. She has not had any previous breast problems. 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 At that time she was living in [place] with her husband who is in the [employer].   Problem List/Past Medical Sierra Key K. 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 Colon Polyp Removal - Colonoscopy Colon Polyp Removal - Open  Diagnostic Studies History Sabino Gasser, CMA; 08/09/2020 9:57 AM) Mammogram within last year Pap Smear 1-5 years ago  Allergies Sabino Gasser, CMA; 08/09/2020 9:59 AM) Sulfa Drugs Allergies Reconciled  Medication History Sabino Gasser, CMA; 08/09/2020 9:59 AM) Atorvastatin Calcium (40MG  Tablet, Oral) Active. Propranolol HCl ER (120MG  Capsule ER 24HR, Oral) Active. SUMAtriptan Succinate (6MG /0.5ML Soln Auto-inj, Subcutaneous) Active. Triamterene-HCTZ (37.5-25MG  Tablet, Oral) Active. Medications Reconciled  Pregnancy / Birth History Sabino Gasser, CMA; 08/09/2020 9:57 AM) Age at menarche 5 years. Age of menopause [age] Gravida 2 Maternal age 25-25 Para 2  Other Problems Sierra [name].  Sierra Defreitas, MD; 08/10/2020 2:31 PM) Back Pain Diverticulosis Hemorrhoids High blood pressure Hypercholesterolemia Lump In Breast     Review of Systems Sabino Gasser CMA; 08/09/2020 9:57 AM) General Not Present- Appetite Loss, Chills, Fatigue, Fever, Night Sweats, Weight Gain and Weight Loss. Skin Not Present- Change in Wart/Mole, Dryness, Hives, Jaundice, New Lesions, Non-Healing Wounds, Rash and Ulcer. HEENT Present- Ringing in the Ears, Visual Disturbances and Wears glasses/contact lenses. Not Present- Earache, Hearing Loss, Hoarseness, Nose Bleed, Oral Ulcers, Seasonal Allergies, Sinus Pain, Sore Throat and Yellow Eyes. Respiratory Not Present- Bloody sputum, Chronic Cough, Difficulty Breathing, Snoring and Wheezing. Cardiovascular Not Present- Chest Pain, Difficulty Breathing Lying Down, Leg Cramps, Palpitations, Rapid Heart Rate, Shortness of Breath and Swelling of Extremities. Gastrointestinal Not Present- Abdominal Pain, Bloating, Bloody Stool, Change in Bowel Habits, Chronic diarrhea, Constipation, Difficulty Swallowing, Excessive gas, Gets full quickly at meals, Hemorrhoids, Indigestion, Nausea, Rectal Pain and Vomiting. Female Genitourinary Present- Nocturia. Not Present- Frequency, Painful Urination, Pelvic Pain and Urgency. Musculoskeletal Present- Back Pain. Not Present- Joint Pain, Joint Stiffness, Muscle Pain, Muscle Weakness and Swelling of Extremities. Neurological Present- Decreased Memory, Numbness, Tingling and Weakness. Not Present- Fainting, Headaches, Seizures, Tremor and Trouble walking. Psychiatric Not Present- Anxiety, Bipolar, Change in Sleep Pattern, Depression, Fearful and Frequent crying. Endocrine Not Present- Cold Intolerance, Excessive Hunger, Hair Changes, Heat Intolerance, Hot flashes and New Diabetes. Hematology Present- Easy Bruising. Not Present- Blood Thinners, Excessive bleeding, Gland problems, HIV and Persistent Infections.  Vitals Sabino Gasser  CMA; 08/09/2020 9:59 AM) 08/09/2020 9:59 AM Weight: 130.8 lb Height: 62in Body Surface Area: 1.6 m Body Mass Index: 23.92 kg/m  Temp.: 97.21F(Tympanic)  Pulse: 75 (Regular)  BP: 130/84(Sitting, Left Arm, Standard)        Physical  Exam Sierra Key K. Samiyah Stupka MD; 08/10/2020 2:31 PM)  The physical exam findings are as follows: Note:Constitutional: WDWN in NAD, conversant, no obvious deformities; resting comfortably Eyes: Pupils equal, round; sclera anicteric; moist conjunctiva; no lid lag HENT: Oral mucosa moist; good dentition Neck: No masses palpated, trachea midline; no thyromegaly Lungs: CTA bilaterally; normal respiratory effort Breasts: symmetric; bilateral flattened nipples; no discharge; no dominant masses but bilateral fibrocystic changes; no axillary lymphadenopathy CV: Regular rate and rhythm; no murmurs; extremities well-perfused with no edema Abd: +bowel sounds, soft, non-tender, no palpable organomegaly; no palpable hernias Musc: Normal gait; no apparent clubbing or cyanosis in extremities Lymphatic: No palpable cervical or axillary lymphadenopathy Skin: Warm, dry; no sign of jaundice Psychiatric - alert and oriented x 4; calm mood and affect    Assessment & Plan Sierra Key K. 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 The surgical procedure has been discussed with the patient. Potential risks, benefits, alternative treatments, and expected outcomes have been explained. All of the patient's questions at this time have been answered. The likelihood of reaching the patient's treatment goal is good. The patient understand the proposed surgical procedure and wishes to proceed.  Sierra [name]. Georgette Dover, MD, [facility] Surgery  General/ Trauma Surgery   08/10/2020 2:34 PM

## 2020-09-03 IMAGING — DX DG CHEST 1V PORT
1 series · 1 of 1 positions shown · non-contrast
Comparison: None.

CLINICAL DATA: Low grade fever, nausea, fatigue for 1 week

EXAM:
PORTABLE CHEST 1 VIEW

[chest ap]
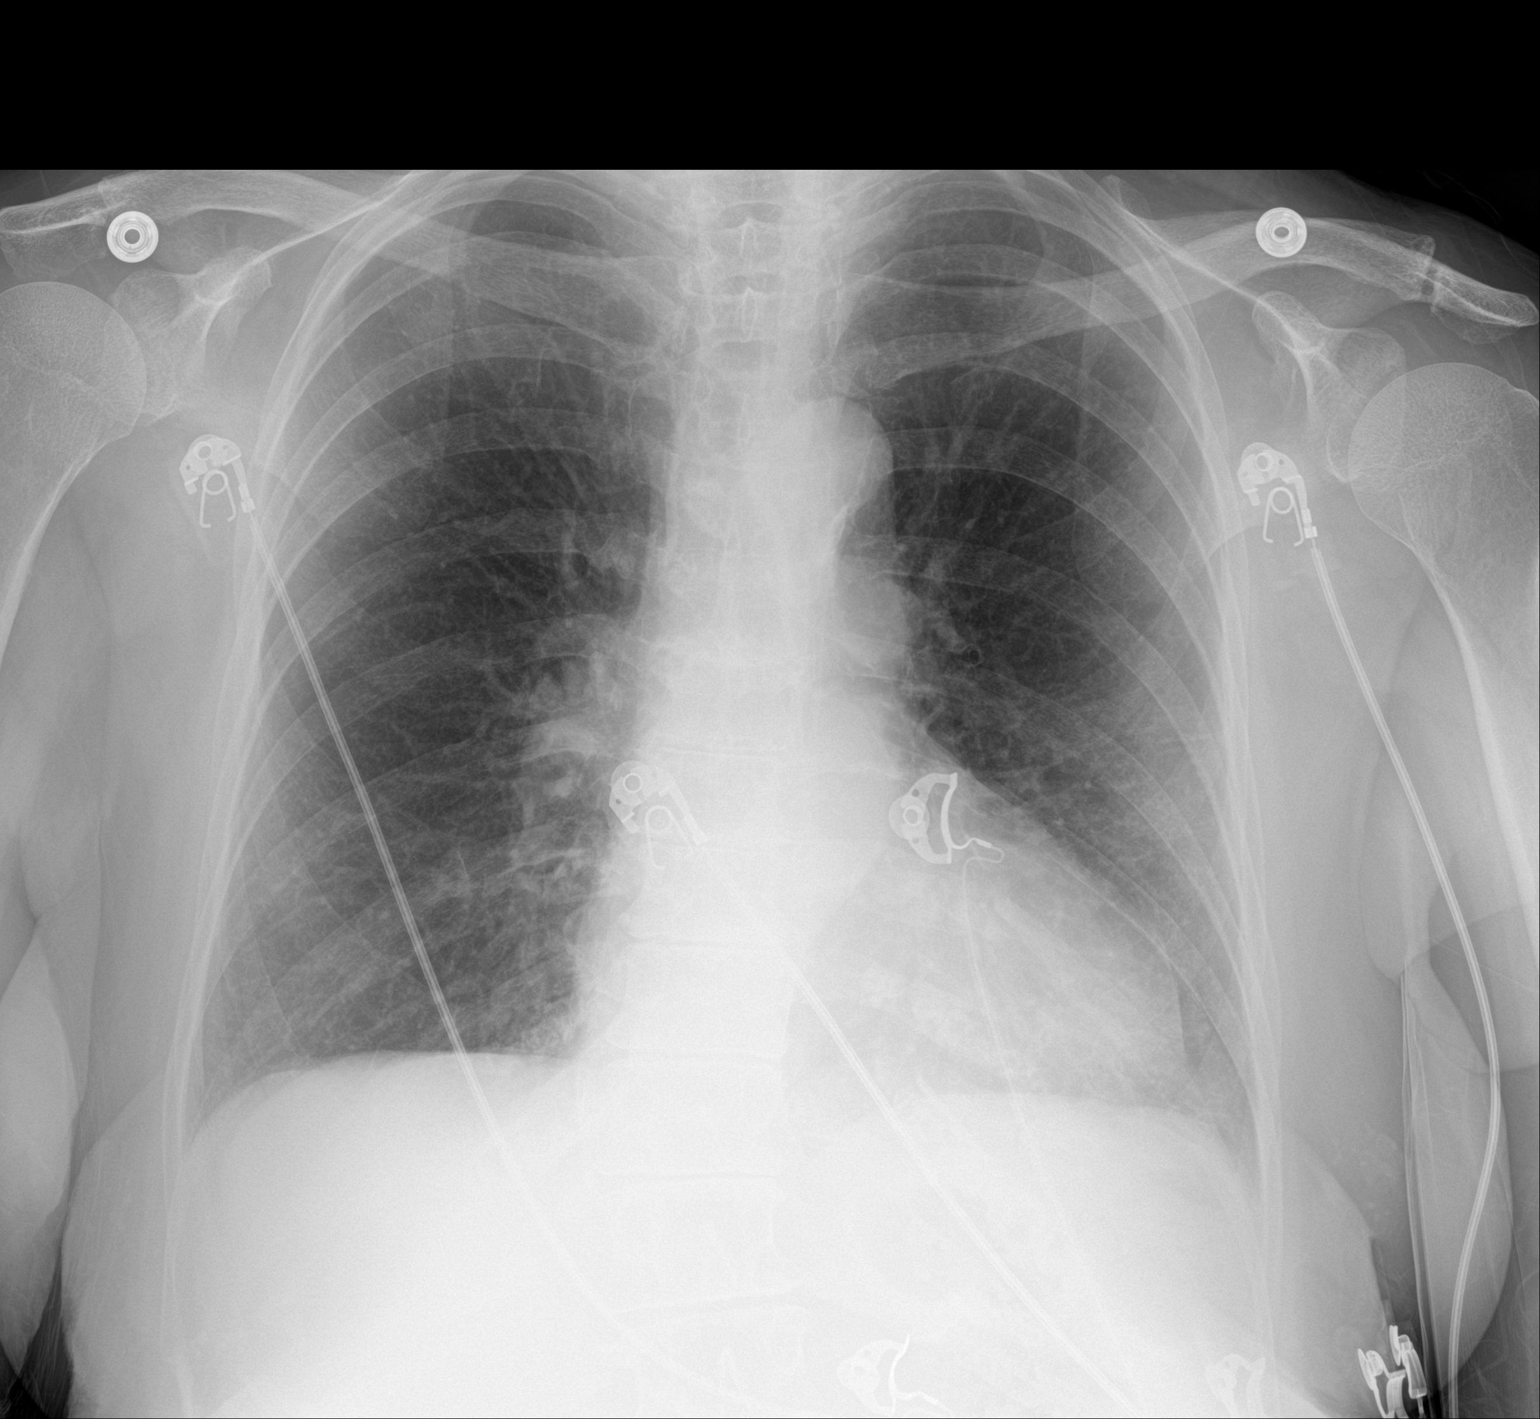

[1 of 1 positions shown; findings below may reference images not displayed]

FINDINGS: The heart size and mediastinal contours are within normal limits.
Both lungs are clear. The visualized skeletal structures are
unremarkable.
IMPRESSION: No active disease.

## 2020-09-14 ENCOUNTER — Other Ambulatory Visit: Payer: Self-pay

## 2020-09-14 ENCOUNTER — Encounter (HOSPITAL_BASED_OUTPATIENT_CLINIC_OR_DEPARTMENT_OTHER): Payer: Self-pay | Admitting: Surgery

## 2020-09-17 ENCOUNTER — Encounter (HOSPITAL_BASED_OUTPATIENT_CLINIC_OR_DEPARTMENT_OTHER)
Admission: RE | Admit: 2020-09-17 | Discharge: 2020-09-17 | Disposition: A | Discharge status: Home / Self Care | Payer: Federal, State, Local not specified - PPO | Source: Ambulatory Visit | Attending: Surgery | Admitting: Surgery

## 2020-09-17 ENCOUNTER — Other Ambulatory Visit (HOSPITAL_COMMUNITY)
Admission: RE | Admit: 2020-09-17 | Discharge: 2020-09-17 | Disposition: A | Discharge status: Home / Self Care | Payer: Federal, State, Local not specified - PPO | Source: Ambulatory Visit | Attending: Surgery | Admitting: Surgery

## 2020-09-17 ENCOUNTER — Other Ambulatory Visit: Payer: Self-pay

## 2020-09-17 DIAGNOSIS — Z20822 Contact with and (suspected) exposure to covid-19: Secondary | ICD-10-CM | POA: Diagnosis not present

## 2020-09-17 DIAGNOSIS — Z01812 Encounter for preprocedural laboratory examination: Secondary | ICD-10-CM | POA: Diagnosis not present

## 2020-09-17 DIAGNOSIS — Z01818 Encounter for other preprocedural examination: Secondary | ICD-10-CM | POA: Insufficient documentation

## 2020-09-17 LAB — BASIC METABOLIC PANEL
Anion gap: 13 (ref 5–15)
BUN: 16 mg/dL (ref 8–23)
CO2: 22 mmol/L (ref 22–32)
Calcium: 9 mg/dL (ref 8.9–10.3)
Chloride: 105 mmol/L (ref 98–111)
Creatinine, Ser: 0.93 mg/dL (ref 0.44–1.00)
GFR, Estimated: >60 mL/min (ref >60)
Glucose, Bld: 103 mg/dL — ABNORMAL HIGH (ref 70–99)
Potassium: 3.8 mmol/L (ref 3.5–5.1)
Sodium: 140 mmol/L (ref 135–145)

## 2020-09-17 LAB — SARS CORONAVIRUS 2 (TAT 6-24 HRS): SARS Coronavirus 2: NEGATIVE

## 2020-09-17 MED ORDER — CHLORHEXIDINE GLUCONATE CLOTH 2 % EX PADS: 6.0000 | MEDICATED_PAD | Freq: Once | CUTANEOUS | Status: DC

## 2020-09-17 NOTE — Progress Notes (Signed)
      Enhanced Recovery after Surgery for Orthopedics Enhanced Recovery after Surgery is a protocol used to improve the stress on your body and your recovery after surgery.  Patient Instructions  . The night before surgery:  o No food after midnight. ONLY clear liquids after midnight  . The day of surgery (if you do NOT have diabetes):  o Drink ONE (1) Pre-Surgery Clear Ensure as directed.   o This drink was given to you during your hospital  pre-op appointment visit. o The pre-op nurse will instruct you on the time to drink the  Pre-Surgery Ensure depending on your surgery time. o Finish the drink at the designated time by the pre-op nurse.  o Nothing else to drink after completing the  Pre-Surgery Clear Ensure.  . The day of surgery (if you have diabetes): o Drink ONE (1) Gatorade 2 (G2) as directed. o This drink was given to you during your hospital  pre-op appointment visit.  o The pre-op nurse will instruct you on the time to drink the   Gatorade 2 (G2) depending on your surgery time. o Color of the Gatorade may vary. Red is not allowed. o Nothing else to drink after completing the  Gatorade 2 (G2).         If you have questions, please contact your surgeon's office.  Patient given soap and instructions, verbalized understanding

## 2020-09-21 ENCOUNTER — Ambulatory Visit (HOSPITAL_BASED_OUTPATIENT_CLINIC_OR_DEPARTMENT_OTHER)
Admission: RE | Admit: 2020-09-21 | Discharge: 2020-09-21 | Disposition: A | Discharge status: Home / Self Care | Payer: Federal, State, Local not specified - PPO | Attending: Surgery | Admitting: Surgery

## 2020-09-21 ENCOUNTER — Ambulatory Visit (HOSPITAL_BASED_OUTPATIENT_CLINIC_OR_DEPARTMENT_OTHER): Payer: Federal, State, Local not specified - PPO | Admitting: Anesthesiology

## 2020-09-21 ENCOUNTER — Encounter (HOSPITAL_BASED_OUTPATIENT_CLINIC_OR_DEPARTMENT_OTHER)
Admission: RE | Disposition: A | Discharge status: Home / Self Care | Payer: Self-pay | Source: Home / Self Care | Attending: Surgery

## 2020-09-21 ENCOUNTER — Other Ambulatory Visit: Payer: Self-pay

## 2020-09-21 ENCOUNTER — Encounter (HOSPITAL_BASED_OUTPATIENT_CLINIC_OR_DEPARTMENT_OTHER): Payer: Self-pay | Admitting: Surgery

## 2020-09-21 DIAGNOSIS — Z79899 Other long term (current) drug therapy: Secondary | ICD-10-CM | POA: Diagnosis not present

## 2020-09-21 DIAGNOSIS — N6081 Other benign mammary dysplasias of right breast: Secondary | ICD-10-CM | POA: Insufficient documentation

## 2020-09-21 DIAGNOSIS — Z882 Allergy status to sulfonamides status: Secondary | ICD-10-CM | POA: Insufficient documentation

## 2020-09-21 DIAGNOSIS — Z905 Acquired absence of kidney: Secondary | ICD-10-CM | POA: Insufficient documentation

## 2020-09-21 DIAGNOSIS — N6091 Unspecified benign mammary dysplasia of right breast: Secondary | ICD-10-CM | POA: Diagnosis present

## 2020-09-21 HISTORY — DX: Nausea with vomiting, unspecified: R11.2

## 2020-09-21 HISTORY — PX: BREAST LUMPECTOMY WITH RADIOACTIVE SEED LOCALIZATION: SHX6424

## 2020-09-21 HISTORY — DX: Other specified postprocedural states: Z98.890

## 2020-09-21 SURGERY — BREAST LUMPECTOMY WITH RADIOACTIVE SEED LOCALIZATION
Anesthesia: General | Site: Breast | Laterality: Right

## 2020-09-21 MED ORDER — EPHEDRINE 5 MG/ML INJ
INTRAVENOUS | Status: AC
  Filled 2020-09-21: qty 10

## 2020-09-21 MED ORDER — LACTATED RINGERS IV SOLN: INTRAVENOUS | Status: DC

## 2020-09-21 MED ORDER — OXYCODONE HCL 5 MG PO TABS: 5.0000 mg | ORAL_TABLET | Freq: Once | ORAL | Status: DC | PRN

## 2020-09-21 MED ORDER — ACETAMINOPHEN 500 MG PO TABS
1000.0000 mg | ORAL_TABLET | ORAL | Status: AC
  Administered 2020-09-21: 07:00:00 1000 mg via ORAL

## 2020-09-21 MED ORDER — OXYCODONE HCL 5 MG/5ML PO SOLN: 5.0000 mg | Freq: Once | ORAL | Status: DC | PRN

## 2020-09-21 MED ORDER — DEXAMETHASONE SODIUM PHOSPHATE 10 MG/ML IJ SOLN
INTRAMUSCULAR | Status: AC
  Filled 2020-09-21: qty 2

## 2020-09-21 MED ORDER — DEXAMETHASONE SODIUM PHOSPHATE 10 MG/ML IJ SOLN
INTRAMUSCULAR | Status: DC | PRN
  Administered 2020-09-21: 10 mg via INTRAVENOUS

## 2020-09-21 MED ORDER — LIDOCAINE HCL (CARDIAC) PF 100 MG/5ML IV SOSY
PREFILLED_SYRINGE | INTRAVENOUS | Status: DC | PRN
  Administered 2020-09-21: 80 mg via INTRAVENOUS

## 2020-09-21 MED ORDER — PROMETHAZINE HCL 25 MG/ML IJ SOLN: 6.2500 mg | INTRAMUSCULAR | Status: DC | PRN

## 2020-09-21 MED ORDER — GLYCOPYRROLATE 0.2 MG/ML IJ SOLN
INTRAMUSCULAR | Status: DC | PRN
  Administered 2020-09-21: .1 mg via INTRAVENOUS

## 2020-09-21 MED ORDER — BUPIVACAINE HCL (PF) 0.25 % IJ SOLN
INTRAMUSCULAR | Status: AC
  Filled 2020-09-21: qty 120

## 2020-09-21 MED ORDER — PROPOFOL 10 MG/ML IV BOLUS
INTRAVENOUS | Status: DC | PRN
  Administered 2020-09-21: 140 mg via INTRAVENOUS

## 2020-09-21 MED ORDER — PROPOFOL 10 MG/ML IV BOLUS
INTRAVENOUS | Status: AC
  Filled 2020-09-21: qty 40

## 2020-09-21 MED ORDER — ACETAMINOPHEN 500 MG PO TABS
ORAL_TABLET | ORAL | Status: AC
  Filled 2020-09-21: qty 2

## 2020-09-21 MED ORDER — AMISULPRIDE (ANTIEMETIC) 5 MG/2ML IV SOLN: 10.0000 mg | Freq: Once | INTRAVENOUS | Status: DC | PRN

## 2020-09-21 MED ORDER — FENTANYL CITRATE (PF) 100 MCG/2ML IJ SOLN
INTRAMUSCULAR | Status: DC | PRN
  Administered 2020-09-21: 25 ug via INTRAVENOUS

## 2020-09-21 MED ORDER — BUPIVACAINE HCL 0.25 % IJ SOLN
INTRAMUSCULAR | Status: DC | PRN
  Administered 2020-09-21: 10 mL

## 2020-09-21 MED ORDER — GABAPENTIN 300 MG PO CAPS
ORAL_CAPSULE | ORAL | Status: AC
  Filled 2020-09-21: qty 1

## 2020-09-21 MED ORDER — ONDANSETRON HCL 4 MG/2ML IJ SOLN
INTRAMUSCULAR | Status: AC
  Filled 2020-09-21: qty 2

## 2020-09-21 MED ORDER — ONDANSETRON HCL 4 MG/2ML IJ SOLN
INTRAMUSCULAR | Status: DC | PRN
  Administered 2020-09-21: 4 mg via INTRAVENOUS

## 2020-09-21 MED ORDER — FENTANYL CITRATE (PF) 100 MCG/2ML IJ SOLN
INTRAMUSCULAR | Status: AC
  Filled 2020-09-21: qty 2

## 2020-09-21 MED ORDER — EPHEDRINE SULFATE 50 MG/ML IJ SOLN
INTRAMUSCULAR | Status: DC | PRN
  Administered 2020-09-21: 5 mg via INTRAVENOUS
  Administered 2020-09-21 (×2): 10 mg via INTRAVENOUS

## 2020-09-21 MED ORDER — CEFAZOLIN SODIUM-DEXTROSE 2-4 GM/100ML-% IV SOLN
INTRAVENOUS | Status: AC
  Filled 2020-09-21: qty 100

## 2020-09-21 MED ORDER — DEXAMETHASONE SODIUM PHOSPHATE 10 MG/ML IJ SOLN
INTRAMUSCULAR | Status: AC
  Filled 2020-09-21: qty 1

## 2020-09-21 MED ORDER — LIDOCAINE 2% (20 MG/ML) 5 ML SYRINGE
INTRAMUSCULAR | Status: AC
  Filled 2020-09-21: qty 5

## 2020-09-21 MED ORDER — CEFAZOLIN SODIUM-DEXTROSE 2-4 GM/100ML-% IV SOLN
2.0000 g | INTRAVENOUS | Status: AC
  Administered 2020-09-21: 08:00:00 2 g via INTRAVENOUS

## 2020-09-21 MED ORDER — FENTANYL CITRATE (PF) 100 MCG/2ML IJ SOLN: 25.0000 ug | INTRAMUSCULAR | Status: DC | PRN

## 2020-09-21 MED ORDER — GABAPENTIN 300 MG PO CAPS
300.0000 mg | ORAL_CAPSULE | ORAL | Status: AC
  Administered 2020-09-21: 07:00:00 300 mg via ORAL

## 2020-09-21 MED ORDER — GLYCOPYRROLATE PF 0.2 MG/ML IJ SOSY
PREFILLED_SYRINGE | INTRAMUSCULAR | Status: AC
  Filled 2020-09-21: qty 1

## 2020-09-21 SURGICAL SUPPLY — 51 items
APL PRP STRL LF DISP 70% ISPRP (MISCELLANEOUS) ×1
APL SKNCLS STERI-STRIP NONHPOA (GAUZE/BANDAGES/DRESSINGS) ×1
APPLIER CLIP 9.375 MED OPEN (MISCELLANEOUS) ×3
APR CLP MED 9.3 20 MLT OPN (MISCELLANEOUS) ×1
BENZOIN TINCTURE PRP APPL 2/3 (GAUZE/BANDAGES/DRESSINGS) ×3 IMPLANT
BLADE HEX COATED 2.75 (ELECTRODE) ×3 IMPLANT
BLADE SURG 15 STRL LF DISP TIS (BLADE) ×1 IMPLANT
BLADE SURG 15 STRL SS (BLADE) ×3
CANISTER SUC SOCK COL 7IN (MISCELLANEOUS) IMPLANT
CANISTER SUCT 1200ML W/VALVE (MISCELLANEOUS) IMPLANT
CHLORAPREP W/TINT 26 (MISCELLANEOUS) ×3 IMPLANT
CLIP APPLIE 9.375 MED OPEN (MISCELLANEOUS) ×1 IMPLANT
CLOSURE WOUND 1/2 X4 (GAUZE/BANDAGES/DRESSINGS) ×1
COVER BACK TABLE 60X90IN (DRAPES) ×3 IMPLANT
COVER MAYO STAND STRL (DRAPES) ×3 IMPLANT
COVER PROBE W GEL 5X96 (DRAPES) ×3 IMPLANT
DECANTER SPIKE VIAL GLASS SM (MISCELLANEOUS) IMPLANT
DRAPE LAPAROTOMY 100X72 PEDS (DRAPES) ×3 IMPLANT
DRAPE UTILITY XL STRL (DRAPES) ×3 IMPLANT
DRSG TEGADERM 4X4.75 (GAUZE/BANDAGES/DRESSINGS) ×3 IMPLANT
ELECT REM PT RETURN 9FT ADLT (ELECTROSURGICAL) ×3
ELECTRODE REM PT RTRN 9FT ADLT (ELECTROSURGICAL) ×1 IMPLANT
GAUZE SPONGE 4X4 12PLY STRL LF (GAUZE/BANDAGES/DRESSINGS) ×3 IMPLANT
GLOVE BIO SURGEON STRL SZ 6.5 (GLOVE) ×2 IMPLANT
GLOVE BIO SURGEON STRL SZ7 (GLOVE) ×3 IMPLANT
GLOVE BIO SURGEONS STRL SZ 6.5 (GLOVE) ×1
GLOVE BIOGEL PI IND STRL 7.5 (GLOVE) ×1 IMPLANT
GLOVE BIOGEL PI INDICATOR 7.5 (GLOVE) ×2
GOWN STRL REUS W/ TWL LRG LVL3 (GOWN DISPOSABLE) ×2 IMPLANT
GOWN STRL REUS W/TWL LRG LVL3 (GOWN DISPOSABLE) ×6
ILLUMINATOR WAVEGUIDE N/F (MISCELLANEOUS) IMPLANT
KIT MARKER MARGIN INK (KITS) ×3 IMPLANT
LIGHT WAVEGUIDE WIDE FLAT (MISCELLANEOUS) IMPLANT
NEEDLE HYPO 25X1 1.5 SAFETY (NEEDLE) ×3 IMPLANT
NS IRRIG 1000ML POUR BTL (IV SOLUTION) ×3 IMPLANT
PACK BASIN DAY SURGERY FS (CUSTOM PROCEDURE TRAY) ×3 IMPLANT
PENCIL SMOKE EVACUATOR (MISCELLANEOUS) ×3 IMPLANT
SLEEVE SCD COMPRESS KNEE MED (MISCELLANEOUS) ×3 IMPLANT
SPONGE LAP 4X18 RFD (DISPOSABLE) ×3 IMPLANT
STRIP CLOSURE SKIN 1/2X4 (GAUZE/BANDAGES/DRESSINGS) ×2 IMPLANT
SUT MON AB 4-0 PC3 18 (SUTURE) ×3 IMPLANT
SUT SILK 2 0 SH (SUTURE) IMPLANT
SUT VIC AB 3-0 SH 27 (SUTURE) ×3
SUT VIC AB 3-0 SH 27X BRD (SUTURE) ×1 IMPLANT
SYR BULB EAR ULCER 3OZ GRN STR (SYRINGE) IMPLANT
SYR CONTROL 10ML LL (SYRINGE) ×3 IMPLANT
TOWEL GREEN STERILE FF (TOWEL DISPOSABLE) ×3 IMPLANT
TRAY FAXITRON CT DISP (TRAY / TRAY PROCEDURE) ×3 IMPLANT
TUBE CONNECTING 20'X1/4 (TUBING)
TUBE CONNECTING 20X1/4 (TUBING) IMPLANT
YANKAUER SUCT BULB TIP NO VENT (SUCTIONS) IMPLANT

## 2020-09-21 NOTE — Transfer of Care (Signed)
Immediate Anesthesia Transfer of Care Note  Patient: Sierra [name]  Procedure(s) Performed: RIGHT BREAST LUMPECTOMY WITH RADIOACTIVE SEED LOCALIZATION (Right Breast)  Patient Location: PACU  Anesthesia Type:General  Level of Consciousness: drowsy  Airway & Oxygen Therapy: Patient Spontanous Breathing and Patient connected to face mask oxygen  Post-op Assessment: Report given to RN and Post -op Vital signs reviewed and stable  Post vital signs: Reviewed and stable  Last Vitals:  Vitals Value Taken Time  BP 110/46 09/21/20 0812  Temp    Pulse 71 09/21/20 0813  Resp 32 09/21/20 0813  SpO2 100 % 09/21/20 0813  Vitals shown include unvalidated device data.  Last Pain:  Vitals:   09/21/20 0649  TempSrc: Oral  PainSc: 0-No pain      Patients Stated Pain Goal: 3 (94/47/39 [id number])  Complications: No complications documented.

## 2020-09-21 NOTE — Anesthesia Preprocedure Evaluation (Addendum)
Anesthesia Evaluation  Patient identified by MRN, date of birth, ID band Patient awake    Reviewed: Allergy & Precautions, NPO status , Patient's Chart, lab work & pertinent test results  History of Anesthesia Complications (+) PONV  Airway Mallampati: II  TM Distance: >3 FB Neck ROM: Full    Dental no notable dental hx.    Pulmonary neg pulmonary ROS,    Pulmonary exam normal breath sounds clear to auscultation       Cardiovascular hypertension, Normal cardiovascular exam Rhythm:Regular Rate:Normal     Neuro/Psych  Headaches, TIA   GI/Hepatic negative GI ROS, Neg liver ROS,   Endo/Other  negative endocrine ROS  Renal/GU   negative genitourinary   Musculoskeletal negative musculoskeletal ROS (+)   Abdominal Normal abdominal exam  (+)   Peds negative pediatric ROS (+)  Hematology negative hematology ROS (+)   Anesthesia Other Findings   Reproductive/Obstetrics negative OB ROS                             Anesthesia Physical Anesthesia Plan  ASA: II  Anesthesia Plan: General   Post-op Pain Management:    Induction: Intravenous  PONV Risk Score and Plan: 4 or greater and Dexamethasone, Ondansetron, Treatment may vary due to age or medical condition, Propofol infusion and TIVA  Airway Management Planned: LMA  Additional Equipment:   Intra-op Plan:   Post-operative Plan: Extubation in OR  Informed Consent: I have reviewed the patients History and Physical, chart, labs and discussed the procedure including the risks, benefits and alternatives for the proposed anesthesia with the patient or authorized representative who has indicated his/her understanding and acceptance.     Dental advisory given  Plan Discussed with: CRNA and Anesthesiologist  Anesthesia Plan Comments: (H/o PONV requiring readmission. GA/LMA)       Anesthesia Quick Evaluation

## 2020-09-21 NOTE — Op Note (Signed)
Pre-op Diagnosis:  Right atypical lobular hyperplasia Post-op Diagnosis: same Procedure:  Right radioactive seed localized lumpectomy Surgeon:  Shatika Grinnell K. Anesthesia:  GEN - LMA Indications:  The patient is a 79 year old female who presents with a breast mass. Referred by Dr. Reynaldo Minium for right breast mass  Mammograms were performed at [facility].  This is a 79 year old female in relatively good health who presents after recent right mammogram. This showed an area of distortion in the right lower outer quadrant measuring 3 mm across. This is located 7 cm from the nipple. Subsequently she underwent biopsy of this area. Biopsy confirmed atypical lobular hyperplasia with microcalcifications. The patient is now referred to [place] to discuss excision.  Description of procedure: The patient is brought to the operating room placed in supine position on the operating room table. After an adequate level of general anesthesia was obtained, her right breast was prepped with ChloraPrep and draped in sterile fashion. A timeout was taken to ensure the proper patient and proper procedure. We interrogated the breast with the neoprobe. We made a transverse incision over the area of radioactivity in the right lower outer quadrant after infiltrating with 0.25% Marcaine. Dissection was carried down in the breast tissue with cautery. We used the neoprobe to guide [place] towards the radioactive seed. We excised an area of tissue around the radioactive seed 1.5 cm in diameter. The specimen was removed and was oriented with a paint kit. Specimen mammogram showed the radioactive seed as well as the biopsy clip within the specimen. This was sent for pathologic examination. There is no residual radioactivity within the biopsy cavity. We inspected carefully for hemostasis. The wound was thoroughly irrigated. The wound was closed with a deep layer of 3-0 Vicryl and a subcuticular layer of 4-0 Monocryl. Benzoin Steri-Strips were  applied. The patient was then extubated and brought to the recovery room in stable condition. All sponge, instrument, and needle counts are correct.  Imogene Burn. Georgette Dover, MD, [facility] Surgery  General/ Trauma Surgery  09/21/2020 8:12 AM

## 2020-09-21 NOTE — H&P (Signed)
History of Present Illness  The patient is a 79 year old female who presents with a breast mass. Referred by Dr. Reynaldo Minium for right breast mass  Mammograms were performed at [facility].  This is a 79 year old female in relatively good health who presents after recent right mammogram. This showed an area of distortion in the right upper outer quadrant measuring 3 mm across. This is located 7 cm from the nipple. Subsequently she underwent biopsy of this area. Biopsy confirmed atypical lobular hyperplasia with microcalcifications. The patient is now referred to [place] to discuss excision. The patient has a family history of living into the upper 90s or early 25s. She is quite active. She has not had any previous breast problems. 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 At that time she was living in [place] with her husband who is in the [employer].   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 Colon Polyp Removal - Colonoscopy Colon Polyp Removal - Open  Diagnostic Studies History  Mammogram within last year Pap Smear 1-5 years ago  Allergies  Sulfa Drugs Allergies Reconciled  Medication History  Atorvastatin Calcium (40MG  Tablet, Oral) Active. Propranolol HCl ER (120MG  Capsule ER 24HR, Oral) Active. SUMAtriptan Succinate (6MG /0.5ML Soln Auto-inj, Subcutaneous) Active. Triamterene-HCTZ (37.5-25MG  Tablet, Oral) Active. Medications Reconciled  Pregnancy / Birth History  Age at menarche 46 years. Age of menopause 84-60 Gravida 2 Maternal age 11-25 Para 2  Other Problems  Back Pain Diverticulosis Hemorrhoids High blood pressure Hypercholesterolemia Lump In Breast     Review of Systems  General Not Present- Appetite Loss, Chills, Fatigue, Fever, Night Sweats, Weight Gain and Weight Loss. Skin Not Present- Change in Wart/Mole,  Dryness, Hives, Jaundice, New Lesions, Non-Healing Wounds, Rash and Ulcer. HEENT Present- Ringing in the Ears, Visual Disturbances and Wears glasses/contact lenses. Not Present- Earache, Hearing Loss, Hoarseness, Nose Bleed, Oral Ulcers, Seasonal Allergies, Sinus Pain, Sore Throat and Yellow Eyes. Respiratory Not Present- Bloody sputum, Chronic Cough, Difficulty Breathing, Snoring and Wheezing. Cardiovascular Not Present- Chest Pain, Difficulty Breathing Lying Down, Leg Cramps, Palpitations, Rapid Heart Rate, Shortness of Breath and Swelling of Extremities. Gastrointestinal Not Present- Abdominal Pain, Bloating, Bloody Stool, Change in Bowel Habits, Chronic diarrhea, Constipation, Difficulty Swallowing, Excessive gas, Gets full quickly at meals, Hemorrhoids, Indigestion, Nausea, Rectal Pain and Vomiting. Female Genitourinary Present- Nocturia. Not Present- Frequency, Painful Urination, Pelvic Pain and Urgency. Musculoskeletal Present- Back Pain. Not Present- Joint Pain, Joint Stiffness, Muscle Pain, Muscle Weakness and Swelling of Extremities. Neurological Present- Decreased Memory, Numbness, Tingling and Weakness. Not Present- Fainting, Headaches, Seizures, Tremor and Trouble walking. Psychiatric Not Present- Anxiety, Bipolar, Change in Sleep Pattern, Depression, Fearful and Frequent crying. Endocrine Not Present- Cold Intolerance, Excessive Hunger, Hair Changes, Heat Intolerance, Hot flashes and New Diabetes. Hematology Present- Easy Bruising. Not Present- Blood Thinners, Excessive bleeding, Gland problems, HIV and Persistent Infections.  Vitals  Weight: 130.8 lb Height: 62in Body Surface Area: 1.6 m Body Mass Index: 23.92 kg/m  Temp.: 97.82F(Tympanic)  Pulse: 75 (Regular)  BP: 130/84(Sitting, Left Arm, Standard)        Physical Exam   The physical exam findings are as follows: Note:Constitutional: WDWN in NAD, conversant, no obvious deformities; resting  comfortably Eyes: Pupils equal, round; sclera anicteric; moist conjunctiva; no lid lag HENT: Oral mucosa moist; good dentition Neck: No masses palpated, trachea midline; no thyromegaly Lungs: CTA bilaterally; normal respiratory effort Breasts: symmetric; bilateral flattened nipples;  no discharge; no dominant masses but bilateral fibrocystic changes; no axillary lymphadenopathy CV: Regular rate and rhythm; no murmurs; extremities well-perfused with no edema Abd: +bowel sounds, soft, non-tender, no palpable organomegaly; no palpable hernias Musc: Normal gait; no apparent clubbing or cyanosis in extremities Lymphatic: No palpable cervical or axillary lymphadenopathy Skin: Warm, dry; no sign of jaundice Psychiatric - alert and oriented x 4; calm mood and affect    Assessment & Plan   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 The surgical procedure has been discussed with the patient. Potential risks, benefits, alternative treatments, and expected outcomes have been explained. All of the patient's questions at this time have been answered. The likelihood of reaching the patient's treatment goal is good. The patient understand the proposed surgical procedure and wishes to proceed.   Imogene Burn. Georgette Dover, MD, [facility]  General/ Trauma Surgery   09/21/2020 7:20 AM

## 2020-09-21 NOTE — Anesthesia Procedure Notes (Signed)
Procedure Name: LMA Insertion Date/Time: 09/21/2020 7:37 AM Performed by: Lavonia Dana, CRNA Pre-anesthesia Checklist: Patient identified, Emergency Drugs available, Suction available and Patient being monitored Patient Re-evaluated:Patient Re-evaluated prior to induction Oxygen Delivery Method: Circle system utilized Preoxygenation: Pre-oxygenation with 100% oxygen Induction Type: IV induction Ventilation: Mask ventilation without difficulty LMA: LMA inserted LMA Size: 4.0 Number of attempts: 1 Airway Equipment and Method: Bite block Placement Confirmation: positive ETCO2 Tube secured with: Tape Dental Injury: Teeth and Oropharynx as per pre-operative assessment

## 2020-09-21 NOTE — Discharge Instructions (Signed)
[facility] °Office Phone Number [phone] ° °BREAST BIOPSY/ PARTIAL MASTECTOMY: POST OP INSTRUCTIONS ° °Always review your discharge instruction sheet given to you by the facility where your surgery was performed. ° °IF YOU HAVE DISABILITY OR FAMILY LEAVE FORMS, YOU MUST BRING THEM TO THE OFFICE FOR PROCESSING.  DO NOT GIVE THEM TO YOUR DOCTOR. ° °1. A prescription for pain medication may be given to you upon discharge.  Take your pain medication as prescribed, if needed.  If narcotic pain medicine is not needed, then you may take acetaminophen (Tylenol) or ibuprofen (Advil) as needed. °2. Take your usually prescribed medications unless otherwise directed °3. If you need a refill on your pain medication, please contact your pharmacy.  They will contact our office to request authorization.  Prescriptions will not be filled after 5pm or on week-ends. °4. You should eat very light the first 24 hours after surgery, such as soup, crackers, pudding, etc.  Resume your normal diet the day after surgery. °5. Most patients will experience some swelling and bruising in the breast.  Ice packs and a good support bra will help.  Swelling and bruising can take several days to resolve.  °6. It is common to experience some constipation if taking pain medication after surgery.  Increasing fluid intake and taking a stool softener will usually help or prevent this problem from occurring.  A mild laxative (Milk of Magnesia or Miralax) should be taken according to package directions if there are no bowel movements after 48 hours. °7. Unless discharge instructions indicate otherwise, you may remove your bandages 24-48 hours after surgery, and you may shower at that time.  You may have steri-strips (small skin tapes) in place directly over the incision.  These strips should be left on the skin for 7-10 days.  If your surgeon used skin glue on the incision, you may shower in 24 hours.  The glue will flake off over the  next 2-3 weeks.  Any sutures or staples will be removed at the office during your follow-up visit. °8. ACTIVITIES:  You may resume regular daily activities (gradually increasing) beginning the next day.  Wearing a good support bra or sports bra minimizes pain and swelling.  You may have sexual intercourse when it is comfortable. °a. You may drive when you no longer are taking prescription pain medication, you can comfortably wear a seatbelt, and you can safely maneuver your car and apply brakes. °b. RETURN TO WORK:  ______________________________________________________________________________________ °9. You should see your doctor in the office for a follow-up appointment approximately two weeks after your surgery.  Your doctor’s nurse will typically make your follow-up appointment when she calls you with your pathology report.  Expect your pathology report 2-3 business days after your surgery.  You may call to check if you do not hear from us after three days. °10. OTHER INSTRUCTIONS: _______________________________________________________________________________________________ _____________________________________________________________________________________________________________________________________ °_____________________________________________________________________________________________________________________________________ °_____________________________________________________________________________________________________________________________________ ° °WHEN TO CALL YOUR DOCTOR: °1. Fever over 101.0 °2. Nausea and/or vomiting. °3. Extreme swelling or bruising. °4. Continued bleeding from incision. °5. Increased pain, redness, or drainage from the incision. ° °The clinic staff is available to answer your questions during regular business hours.  Please don’t hesitate to call and ask to speak to one of the nurses for clinical concerns.  If you have a medical emergency, go to the nearest  emergency room or call 911.  A surgeon from [facility] is always on call at the hospital. ° °For further questions, please visit [email or web address]  ° ° °  Next dose of Tylenol can be given after 1:03PM.   Post Anesthesia Home Care Instructions  Activity: Get plenty of rest for the remainder of the day. A responsible individual must stay with you for 24 hours following the procedure.  For the next 24 hours, DO NOT: -Drive a car -Paediatric nurse -Drink alcoholic beverages -Take any medication unless instructed by your physician -Make any legal decisions or sign important papers.  Meals: Start with liquid foods such as gelatin or soup. Progress to regular foods as tolerated. Avoid greasy, spicy, heavy foods. If nausea and/or vomiting occur, drink only clear liquids until the nausea and/or vomiting subsides. Call your physician if vomiting continues.  Special Instructions/Symptoms: Your throat may feel dry or sore from the anesthesia or the breathing tube placed in your throat during surgery. If this causes discomfort, gargle with warm salt water. The discomfort should disappear within 24 hours.  If you had a scopolamine patch placed behind your ear for the management of post- operative nausea and/or vomiting:  1. The medication in the patch is effective for 72 hours, after which it should be removed.  Wrap patch in a tissue and discard in the trash. Wash hands thoroughly with soap and water. 2. You may remove the patch earlier than 72 hours if you experience unpleasant side effects which may include dry mouth, dizziness or visual disturbances. 3. Avoid touching the patch. Wash your hands with soap and water after contact with the patch.

## 2020-09-21 NOTE — Anesthesia Postprocedure Evaluation (Signed)
Anesthesia Post Note  Patient: ELIANNA WINDOM  Procedure(s) Performed: RIGHT BREAST LUMPECTOMY WITH RADIOACTIVE SEED LOCALIZATION (Right Breast)     Patient location during evaluation: PACU Anesthesia Type: General Level of consciousness: awake Pain management: pain level controlled Vital Signs Assessment: post-procedure vital signs reviewed and stable Respiratory status: spontaneous breathing and respiratory function stable Cardiovascular status: stable Postop Assessment: no apparent nausea or vomiting Anesthetic complications: no   No complications documented.  Last Vitals:  Vitals:   09/21/20 0830 09/21/20 0900  BP: (!) 117/50 (!) 119/57  Pulse: 71 72  Resp: 17 16  Temp:  36.5 C  SpO2: 97% 98%    Last Pain:  Vitals:   09/21/20 0845  TempSrc:   PainSc: 0-No pain                 Merlinda Frederick

## 2020-09-22 ENCOUNTER — Encounter (HOSPITAL_BASED_OUTPATIENT_CLINIC_OR_DEPARTMENT_OTHER): Payer: Self-pay | Admitting: Surgery

## 2020-09-24 LAB — SURGICAL PATHOLOGY

## 2020-09-28 ENCOUNTER — Other Ambulatory Visit: Payer: Self-pay | Admitting: *Deleted

## 2020-09-28 MED ORDER — PROPRANOLOL HCL ER 120 MG PO CP24: 120.0000 mg | ORAL_CAPSULE | Freq: Every day | ORAL | 0 refills | Status: DC

## 2020-10-14 ENCOUNTER — Other Ambulatory Visit: Payer: Self-pay | Admitting: Surgery

## 2020-10-28 ENCOUNTER — Ambulatory Visit: Payer: Federal, State, Local not specified - PPO | Admitting: Neurology

## 2020-12-16 ENCOUNTER — Other Ambulatory Visit: Payer: Self-pay | Admitting: Neurology

## 2021-04-27 ENCOUNTER — Encounter: Payer: Self-pay | Admitting: Neurology

## 2021-04-27 ENCOUNTER — Other Ambulatory Visit: Payer: Self-pay

## 2021-04-27 ENCOUNTER — Ambulatory Visit: Payer: Federal, State, Local not specified - PPO | Admitting: Neurology

## 2021-04-27 VITALS — BP 158/90 | HR 64 | Ht 62.0 in | Wt 131.0 lb

## 2021-04-27 DIAGNOSIS — G43009 Migraine without aura, not intractable, without status migrainosus: Secondary | ICD-10-CM

## 2021-04-27 DIAGNOSIS — E538 Deficiency of other specified B group vitamins: Secondary | ICD-10-CM | POA: Diagnosis not present

## 2021-04-27 DIAGNOSIS — G43109 Migraine with aura, not intractable, without status migrainosus: Secondary | ICD-10-CM

## 2021-04-27 HISTORY — DX: Deficiency of other specified B group vitamins: E53.8

## 2021-04-27 NOTE — Progress Notes (Signed)
Reason for visit: Migraine headache, vitamin B12 deficiency  Sierra [name] is an 80 y.o. female  History of present illness:  Sierra [name] is a 80 year old left-handed white female with a history of migraine headaches with sensory aura, she may at times have a migraine equivalent.  These episodes have become much less frequent, she is doing quite well with her propranolol taken on 120 mg LA tablet daily.  She has had some mild cognitive clouding following a COVID infection last fall.  She began noting some numbness in the feet, she was found to have a vitamin B12 deficiency in January 2022, oral B12 supplementation has helped some, but she still has some residual numbness.  She denies any significant balance issues.  She in the past has had some right upper arm discomfort without any neck pain, her discomfort would be improved by elevation of the arm.  This discomfort has gone away, she does not have any problems currently.  Past Medical History:  Diagnosis Date   Cataract    Chronic kidney disease    deformed left kidney- removed at age 39   Common migraine 10/21/2015   Hyperlipidemia    Hypertension    Macular degeneration    Eyelea injections every 16 weeks   Osteoporosis    take Reclast yearly   PONV (postoperative nausea and vomiting)    extreme has put her in the hospital    Past Surgical History:  Procedure Laterality Date   BREAST LUMPECTOMY WITH RADIOACTIVE SEED LOCALIZATION Right 09/21/2020   Procedure: RIGHT BREAST LUMPECTOMY WITH RADIOACTIVE SEED LOCALIZATION;  Surgeon: Donnie Mesa, MD;  Location: [place] [facility];  Service: General;  Laterality: Right;   CATARACT EXTRACTION, BILATERAL Bilateral L 03-27-16  R 04-24-16   COLONOSCOPY     screenin gonly benign polyps removed   NEPHRECTOMY  1963   left   TUBAL LIGATION  1975    Family History  Problem Relation Age of Onset   Thyroid disease Mother    Dementia Father    Colon cancer Neg Hx    Esophageal  cancer Neg Hx    Stomach cancer Neg Hx    Rectal cancer Neg Hx     Social history:  reports that she has never smoked. She has never used smokeless tobacco. She reports that she does not drink alcohol and does not use drugs.    Allergies  Allergen Reactions   Sulfamethoxazole Nausea Only    Medications:  Prior to Admission medications   Medication Sig Start Date End Date Taking? Authorizing Provider  acetaminophen (TYLENOL) 500 MG tablet Take 500 mg by mouth every 6 (six) hours as needed for mild pain or headache.   Yes [provider]  aspirin 81 MG chewable tablet Chew 81 mg by mouth daily.   Yes [provider]  atorvastatin (LIPITOR) 40 MG tablet Take 40 mg by mouth daily at 6 PM.  07/01/11  Yes [provider]  Calcium Carb-Cholecalciferol (CALCIUM + D3) 600-200 MG-UNIT TABS Take 1 tablet by mouth 2 (two) times daily.   Yes [provider]  Coenzyme Q10 (CO Q 10 PO) Take 1 capsule by mouth daily.   Yes [provider]  docusate sodium (COLACE) 100 MG capsule Take 100 mg by mouth daily.   Yes [provider]  Lysine 1000 MG TABS Take 1,000 mg by mouth daily.    Yes [provider]  Misc Natural Products (TURMERIC CURCUMIN) CAPS Take 1 capsule  by mouth daily.    Yes [provider]  Omega-3 Fatty Acids (FISH OIL) 1000 MG CAPS Take 1 capsule by mouth daily.    Yes [provider]  polyvinyl alcohol (LIQUIFILM TEARS) 1.4 % ophthalmic solution Place 1 drop into both eyes as needed for dry eyes.   Yes [provider]  propranolol ER (INDERAL LA) 120 MG 24 hr capsule TAKE 1 CAPSULE(120 MG) BY MOUTH DAILY 12/20/20  Yes [name], MD  SUMAtriptan (IMITREX) 6 MG/0.5ML SOLN injection Inject 6 mg into the skin every 2 (two) hours as needed for migraine or headache. May repeat in 2 hours if headache persists or recurs.   Yes [provider]  triamterene-hydrochlorothiazide (MAXZIDE-25) 37.5-25  MG tablet Take 1 tablet by mouth at bedtime. 09/06/15  Yes [name], MD  UNABLE TO FIND Take 1 tablet by mouth daily. Med Name: Prevagen   Yes [provider]  vitamin B-12 (CYANOCOBALAMIN) 1000 MCG tablet Take 1,000 mcg by mouth daily.   Yes [provider]    ROS:  Out of a complete 14 system review of symptoms, the patient complains only of the following symptoms, and all other reviewed systems are negative.  Transient right arm pain Numbness in the feet Headache  Blood pressure (!) 158/90, pulse 64, height 5\' 2"  (1.575 m), weight 131 lb (59.4 kg).  Physical Exam  General: The patient is alert and cooperative at the time of the examination.  Skin: No significant peripheral edema is noted.   Neurologic Exam  Mental status: The patient is alert and oriented x 3 at the time of the examination. The patient has apparent normal recent and remote memory, with an apparently normal attention span and concentration ability.   Cranial nerves: Facial symmetry is present. Speech is normal, no aphasia or dysarthria is noted. Extraocular movements are full. Visual fields are full.  Motor: The patient has good strength in all 4 extremities.  Sensory examination: Soft touch sensation is symmetric on the face, arms, and legs.  Coordination: The patient has good finger-nose-finger and heel-to-shin bilaterally.  Gait and station: The patient has a normal gait. Tandem gait is normal. Romberg is negative. No drift is seen.  Reflexes: Deep tendon reflexes are symmetric, but are decreased.   Assessment/Plan:  1.  Migraine headache, migraine equivalent  2.  Vitamin B12 deficiency  We will check a vitamin B12 level today.  She will remain on oral supplementation for now.  She will continue her propranolol.  She will follow-up in 6 months, in the future she can be followed by Dr. Jaynee Eagles.  [name] MD 04/27/2021 7:46 AM  [facility] [address] [place], [place] [address]  Phone [phone] Fax [phone]

## 2021-05-01 LAB — METHYLMALONIC ACID, SERUM: Methylmalonic Acid: 169 nmol/L (ref 0–378)

## 2021-05-01 LAB — VITAMIN B12: Vitamin B-12: >2000 pg/mL — ABNORMAL HIGH (ref 232–1245)

## 2021-11-02 ENCOUNTER — Ambulatory Visit: Payer: Federal, State, Local not specified - PPO | Admitting: Neurology

## 2021-11-22 ENCOUNTER — Encounter (HOSPITAL_COMMUNITY): Payer: Self-pay

## 2022-05-02 ENCOUNTER — Encounter (HOSPITAL_BASED_OUTPATIENT_CLINIC_OR_DEPARTMENT_OTHER): Payer: Self-pay | Admitting: Obstetrics and Gynecology

## 2022-05-02 ENCOUNTER — Emergency Department (HOSPITAL_BASED_OUTPATIENT_CLINIC_OR_DEPARTMENT_OTHER)
Admission: EM | Admit: 2022-05-02 | Discharge: 2022-05-02 | Disposition: A | Discharge status: Home / Self Care | Payer: Federal, State, Local not specified - PPO | Attending: Emergency Medicine | Admitting: Emergency Medicine

## 2022-05-02 ENCOUNTER — Other Ambulatory Visit: Payer: Self-pay

## 2022-05-02 DIAGNOSIS — S40862A Insect bite (nonvenomous) of left upper arm, initial encounter: Secondary | ICD-10-CM | POA: Diagnosis not present

## 2022-05-02 DIAGNOSIS — R21 Rash and other nonspecific skin eruption: Secondary | ICD-10-CM

## 2022-05-02 DIAGNOSIS — Z7982 Long term (current) use of aspirin: Secondary | ICD-10-CM | POA: Insufficient documentation

## 2022-05-02 DIAGNOSIS — I129 Hypertensive chronic kidney disease with stage 1 through stage 4 chronic kidney disease, or unspecified chronic kidney disease: Secondary | ICD-10-CM | POA: Insufficient documentation

## 2022-05-02 DIAGNOSIS — N189 Chronic kidney disease, unspecified: Secondary | ICD-10-CM | POA: Diagnosis not present

## 2022-05-02 DIAGNOSIS — W57XXXA Bitten or stung by nonvenomous insect and other nonvenomous arthropods, initial encounter: Secondary | ICD-10-CM | POA: Diagnosis not present

## 2022-05-02 NOTE — ED Triage Notes (Signed)
Patient reports to the ER for a rash on her arm. Patient reports she thought she was bitten by a bug but now has large areas of redness to her left arm in multiple places.

## 2022-05-02 NOTE — ED Provider Notes (Signed)
[facility] EMERGENCY DEPT Provider Note   CSN: [id number] Arrival date & time: 05/02/22  7124     History  Chief Complaint  Patient presents with   Rash    Sierra [name] is a 81 y.o. female.  81 year old female who presents the emergency department with rash of her left arm.  States that she developed a round red lesion of her left arm approximately 5 days ago.  Says that she noticed 3 smaller areas that were similar around her left elbow since then.  Thinks that she may have had mosquito bite unsure of any other insect bites.  No long hiking or time spent gardening.  Denies any joint pains, rashes elsewhere on her body, or close contacts with similar rashes.  Has been using Desitin and baby wash for several days which is improved her symptoms.  Wanted to come in today because wanted to make sure it was not a severe bug bite.   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 Authorizing Provider  acetaminophen (TYLENOL) 500 MG tablet Take 500 mg by mouth every 6 (six) hours as needed for mild pain or headache.    [provider]  aspirin 81 MG chewable tablet Chew 81 mg by mouth daily.    [provider]  atorvastatin (LIPITOR) 40 MG tablet Take 40 mg by mouth daily at 6 PM.  07/01/11   [provider]  Calcium Carb-Cholecalciferol (CALCIUM + D3) 600-200 MG-UNIT TABS Take 1 tablet by mouth 2  (two) times daily.    [provider]  Coenzyme Q10 (CO Q 10 PO) Take 1 capsule by mouth daily.    [provider]  docusate sodium (COLACE) 100 MG capsule Take 100 mg by mouth daily.    [provider]  Lysine 1000 MG TABS Take 1,000 mg by mouth daily.     [provider]  Misc Natural Products (TURMERIC CURCUMIN) CAPS Take 1 capsule by mouth daily.     [provider]  Omega-3 Fatty Acids (FISH OIL) 1000 MG CAPS Take 1 capsule by mouth daily.     [provider]  polyvinyl alcohol (LIQUIFILM TEARS) 1.4 % ophthalmic solution Place 1 drop into both eyes as needed for dry eyes.    [provider]  propranolol ER (INDERAL LA) 120 MG 24 hr capsule TAKE 1 CAPSULE(120 MG) BY MOUTH DAILY 12/20/20   [name], MD  SUMAtriptan (IMITREX) 6 MG/0.5ML SOLN injection Inject 6 mg into the skin every 2 (two) hours as needed for migraine or headache. May repeat in 2 hours if headache persists or recurs.    [provider]  triamterene-hydrochlorothiazide (MAXZIDE-25) 37.5-25 MG tablet Take 1 tablet by mouth at bedtime. 09/06/15   [name], MD  UNABLE TO FIND Take 1 tablet by mouth daily.  Med Name: [place]    [provider]  vitamin B-12 (CYANOCOBALAMIN) 1000 MCG tablet Take 1,000 mcg by mouth daily.    [provider]      Allergies    Sulfamethoxazole    Review of Systems   Review of Systems  Constitutional:  Negative for chills and fever.  Eyes:  Negative for redness.  Musculoskeletal:  Negative for arthralgias and neck pain.  Skin:  Positive for rash. Negative for wound.  Neurological:  Negative for headaches.    Physical Exam Updated Vital Signs BP 134/68   Pulse 63   Temp 98.2 F (36.8 C) (Oral)   Resp 16   Ht '5\' 2"'$  (1.575 m)   Wt 58.5 kg   SpO2 100%   BMI 23.59 kg/m  Physical Exam Vitals and nursing note reviewed.  Constitutional:      General: She is not in acute distress.     Appearance: She is well-developed.  HENT:     Head: Normocephalic and atraumatic.  Eyes:     Extraocular Movements: Extraocular movements intact.     Conjunctiva/sclera: Conjunctivae normal.     Pupils: Pupils are equal, round, and reactive to light.  Pulmonary:     Effort: Pulmonary effort is normal. No respiratory distress.  Musculoskeletal:        General: No swelling. Normal range of motion.     Cervical back: Neck supple.     Comments: Approximately 2 cm erythematous indurated lesion on medial aspect of left elbow.  2 additional subcentimeter papules noted on extensor surface of proximal forearm.  No significant erythema or streaking.  No warmth.  No drainage.  Skin:    General: Skin is warm and dry.  Neurological:     General: No focal deficit present.     Mental Status: She is alert and oriented to person, place, and time. Mental status is at baseline.  Psychiatric:        Mood and Affect: Mood normal.        Behavior: Behavior normal.     ED Results / Procedures / Treatments   Labs (all labs ordered are listed, but only abnormal results are displayed) Labs Reviewed - No data to display  EKG None  Radiology No results found.  Procedures Procedures    Medications Ordered in ED Medications - No data to display  ED Course/ Medical Decision Making/ A&P                           Medical Decision Making 81 year old female who presents to the emergency department with several papules of her left upper extremity that appear to be consistent with insect bites.  No overlying erythema or warmth or signs of cellulitis at this time that would indicate antibiotic coverage.  No additional systemic symptoms at this time so feel that patient is suitable for discharge with OTC cream usage with calamine or pramoxine containing agents be safe for the patient.  She was counseled on this and instructed to follow-up with her primary doctor in several days and to return to the emergency  department should her rash worsen or she develop any concerning symptoms.    Final Clinical Impression(s) / ED Diagnoses Final diagnoses:  None    Rx / DC Orders ED Discharge Orders     None         Fransico Meadow, MD 05/02/22 1030

## 2022-06-21 ENCOUNTER — Other Ambulatory Visit: Payer: Self-pay

## 2022-06-29 ENCOUNTER — Ambulatory Visit: Payer: Self-pay | Admitting: Surgery

## 2022-06-29 DIAGNOSIS — D0501 Lobular carcinoma in situ of right breast: Secondary | ICD-10-CM

## 2022-06-29 NOTE — H&P (Signed)
Subjective    Chief Complaint: new breast ca (New breast ca)       History of Present Illness: Sierra [name] is a 81 y.o. female who is seen today as an office consultation at the request of Dr. Truman Hayward for evaluation of new breast ca (New breast ca) .     This is a relatively healthy 81 year old female who is status post right breast radioactive seed localized lumpectomy on 09/21/2020 which revealed atypical lobular hyperplasia.  The patient recently had a follow-up mammogram on 06/13/2022.  A mass was noted in the right upper outer quadrant close to the area of previous excision.  She underwent further work-up including diagnostic mammogram and biopsy.  This revealed a 0.8 x 0.8 x 0.4 cm mass in the right upper outer quadrant with calcifications.  Ultrasound was negative.  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  Prognostic panel is pending.  She presents now to discuss excision.     Review of Systems: A complete review of systems was obtained from the patient.  I have reviewed this information and discussed as appropriate with the patient.  See HPI as well for other ROS.   Review of Systems  Constitutional: Negative.   HENT:  Positive for tinnitus.   Eyes: Negative.   Respiratory: Negative.    Cardiovascular: Negative.   Gastrointestinal: Negative.   Genitourinary: Negative.   Musculoskeletal: Negative.   Skin: Negative.   Neurological: Negative.   Endo/Heme/Allergies:  Bruises/bleeds easily.  Psychiatric/Behavioral: Negative.         Medical History: Past Medical History      Past Medical History:  Diagnosis Date   DVT (deep venous thrombosis) (CMS-HCC)             Patient Active Problem List  Diagnosis   HTN (hypertension)   Hyperlipidemia   Macular degeneration   Common migraine   Primary osteoarthritis of both first carpometacarpal joints   TIA (transient ischemic  attack)   Lobular carcinoma in situ (LCIS) of right breast      Past Surgical History       Past Surgical History:  Procedure Laterality Date   breast surgery   09/21/2020        Allergies  No Known Allergies           Current Outpatient Medications on File Prior to Visit  Medication Sig Dispense Refill   atorvastatin (LIPITOR) 40 MG tablet Take 40 mg by mouth once daily       doxycycline (VIBRA-TABS) 100 MG tablet Take 100 mg by mouth 2 (two) times daily       levothyroxine (SYNTHROID) 50 MCG tablet TAKE 1 TABLET BY MOUTH EVERY DAY IN THE MORNING ON AN EMPTY STOMACH       triamterene-hydroCHLOROthiazide (MAXZIDE-25) 37.5-25 mg tablet Take 1 tablet by mouth once daily        No current facility-administered medications on file prior to visit.      Family History  History reviewed. No pertinent family history.      Social History       Tobacco Use  Smoking Status Never  Smokeless Tobacco Never      Social History  Social History        Socioeconomic History   Marital status: Divorced  Tobacco Use   Smoking status: Never   Smokeless tobacco: Never  Substance and Sexual Activity   Alcohol use: Never  Drug use: Never        Objective:         Vitals:    06/29/22 1617  BP: 122/78  Pulse: 72  Temp: 36.6 C (97.8 F)  SpO2: 96%  Weight: 59.9 kg (132 lb)  Height: 157.5 cm ('5\' 2"'$ )    Body mass index is 24.14 kg/m.   Physical Exam    Constitutional:  WDWN in NAD, conversant, no obvious deformities; lying in bed comfortably Eyes:  Pupils equal, round; sclera anicteric; moist conjunctiva; no lid lag HENT:  Oral mucosa moist; good dentition  Neck:  No masses palpated, trachea midline; no thyromegaly Lungs:  CTA bilaterally; normal respiratory effort Breasts:  symmetric, no nipple changes; no palpable masses or lymphadenopathy on either side; well-healed right lateral lumpectomy incision CV:  Regular rate and rhythm; no murmurs; extremities  well-perfused with no edema Abd:  +bowel sounds, soft, non-tender, no palpable organomegaly; no palpable hernias Musc:  Unable to assess gait; no apparent clubbing or cyanosis in extremities Lymphatic:  No palpable cervical or axillary lymphadenopathy Skin:  Warm, dry; no sign of jaundice Psychiatric - alert and oriented x 4; calm mood and affect     Labs, Imaging and Diagnostic Testing: CT disc present in HPI   Assessment and Plan:  Diagnoses and all orders for this visit:   Lobular carcinoma in situ (LCIS) of right breast       Due to the pleomorphic nature of this LCIS, would recommend lumpectomy for clear margins.  Recommend right breast radioactive seed localized lumpectomy.The surgical procedure has been discussed with the patient.  Potential risks, benefits, alternative treatments, and expected outcomes have been explained.  All of the patient's questions at this time have been answered.  The likelihood of reaching the patient's treatment goal is good.  The patient understand the proposed surgical procedure and wishes to proceed.         Carlean Jews, MD  06/29/2022 4:44 PM

## 2022-06-29 NOTE — H&P (View-Only) (Signed)
Subjective    Chief Complaint: new breast ca (New breast ca)       History of Present Illness: Sierra [name] is a 81 y.o. female who is seen today as an office consultation at the request of Dr. Truman Hayward for evaluation of new breast ca (New breast ca) .     This is a relatively healthy 81 year old female who is status post right breast radioactive seed localized lumpectomy on 09/21/2020 which revealed atypical lobular hyperplasia.  The patient recently had a follow-up mammogram on 06/13/2022.  A mass was noted in the right upper outer quadrant close to the area of previous excision.  She underwent further work-up including diagnostic mammogram and biopsy.  This revealed a 0.8 x 0.8 x 0.4 cm mass in the right upper outer quadrant with calcifications.  Ultrasound was negative.  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  Prognostic panel is pending.  She presents now to discuss excision.     Review of Systems: A complete review of systems was obtained from the patient.  I have reviewed this information and discussed as appropriate with the patient.  See HPI as well for other ROS.   Review of Systems  Constitutional: Negative.   HENT:  Positive for tinnitus.   Eyes: Negative.   Respiratory: Negative.    Cardiovascular: Negative.   Gastrointestinal: Negative.   Genitourinary: Negative.   Musculoskeletal: Negative.   Skin: Negative.   Neurological: Negative.   Endo/Heme/Allergies:  Bruises/bleeds easily.  Psychiatric/Behavioral: Negative.         Medical History: Past Medical History      Past Medical History:  Diagnosis Date   DVT (deep venous thrombosis) (CMS-HCC)             Patient Active Problem List  Diagnosis   HTN (hypertension)   Hyperlipidemia   Macular degeneration   Common migraine   Primary osteoarthritis of both first carpometacarpal joints   TIA (transient ischemic  attack)   Lobular carcinoma in situ (LCIS) of right breast      Past Surgical History       Past Surgical History:  Procedure Laterality Date   breast surgery   09/21/2020        Allergies  No Known Allergies           Current Outpatient Medications on File Prior to Visit  Medication Sig Dispense Refill   atorvastatin (LIPITOR) 40 MG tablet Take 40 mg by mouth once daily       doxycycline (VIBRA-TABS) 100 MG tablet Take 100 mg by mouth 2 (two) times daily       levothyroxine (SYNTHROID) 50 MCG tablet TAKE 1 TABLET BY MOUTH EVERY DAY IN THE MORNING ON AN EMPTY STOMACH       triamterene-hydroCHLOROthiazide (MAXZIDE-25) 37.5-25 mg tablet Take 1 tablet by mouth once daily        No current facility-administered medications on file prior to visit.      Family History  History reviewed. No pertinent family history.      Social History       Tobacco Use  Smoking Status Never  Smokeless Tobacco Never      Social History  Social History        Socioeconomic History   Marital status: Divorced  Tobacco Use   Smoking status: Never   Smokeless tobacco: Never  Substance and Sexual Activity   Alcohol use: Never  Drug use: Never        Objective:         Vitals:    06/29/22 1617  BP: 122/78  Pulse: 72  Temp: 36.6 C (97.8 F)  SpO2: 96%  Weight: 59.9 kg (132 lb)  Height: 157.5 cm ('5\' 2"'$ )    Body mass index is 24.14 kg/m.   Physical Exam    Constitutional:  WDWN in NAD, conversant, no obvious deformities; lying in bed comfortably Eyes:  Pupils equal, round; sclera anicteric; moist conjunctiva; no lid lag HENT:  Oral mucosa moist; good dentition  Neck:  No masses palpated, trachea midline; no thyromegaly Lungs:  CTA bilaterally; normal respiratory effort Breasts:  symmetric, no nipple changes; no palpable masses or lymphadenopathy on either side; well-healed right lateral lumpectomy incision CV:  Regular rate and rhythm; no murmurs; extremities  well-perfused with no edema Abd:  +bowel sounds, soft, non-tender, no palpable organomegaly; no palpable hernias Musc:  Unable to assess gait; no apparent clubbing or cyanosis in extremities Lymphatic:  No palpable cervical or axillary lymphadenopathy Skin:  Warm, dry; no sign of jaundice Psychiatric - alert and oriented x 4; calm mood and affect     Labs, Imaging and Diagnostic Testing: CT disc present in HPI   Assessment and Plan:  Diagnoses and all orders for this visit:   Lobular carcinoma in situ (LCIS) of right breast       Due to the pleomorphic nature of this LCIS, would recommend lumpectomy for clear margins.  Recommend right breast radioactive seed localized lumpectomy.The surgical procedure has been discussed with the patient.  Potential risks, benefits, alternative treatments, and expected outcomes have been explained.  All of the patient's questions at this time have been answered.  The likelihood of reaching the patient's treatment goal is good.  The patient understand the proposed surgical procedure and wishes to proceed.         Carlean Jews, MD  06/29/2022 4:44 PM

## 2022-07-06 NOTE — Pre-Procedure Instructions (Addendum)
Surgical Instructions    Your procedure is scheduled on Thursday, July 13, 2022 at 10:45 AM.  Report to [facility] Main Entrance "A" at 8:45 A.M., then check in with the Admitting office.  Call this number if you have problems the morning of surgery:  [phone]   If you have any questions prior to your surgery date call [phone]: Open Monday-Friday 8am-4pm  *If you experience any cold or flu symptoms such as cough, fever, chills, shortness of breath, etc. between now and your scheduled surgery, please notify us.*    Remember:  Do not eat after midnight the night before your surgery  You may drink clear liquids until 7:45 AM the morning of your surgery.   Clear liquids allowed are: Water, Non-Citrus Juices (without pulp), Carbonated Beverages, Clear Tea, Black Coffee Only (NO MILK, CREAM OR POWDERED CREAMER of any kind), and Gatorade.     Take these medicines the morning of surgery with A SIP OF WATER:  levothyroxine (SYNTHROID) propranolol ER (INDERAL LA)   IF NEEDED: acetaminophen (TYLENOL) polyvinyl alcohol (LIQUIFILM TEARS)  SUMAtriptan (IMITREX)  As of today, STOP taking any Aspirin (unless otherwise instructed by your surgeon) Aleve, Naproxen, Ibuprofen, Motrin, Advil, Goody's, BC's, all herbal medications, fish oil, and all vitamins.                     Do NOT Smoke (Tobacco/Vaping) for 24 hours prior to your procedure.  If you use a CPAP at night, you may bring your mask/headgear for your overnight stay.   Contacts, glasses, piercing's, hearing aid's, dentures or partials may not be worn into surgery, please bring cases for these belongings.    For patients admitted to the hospital, discharge time will be determined by your treatment team.   Patients discharged the day of surgery will not be allowed to drive home, and someone needs to stay with them for 24 hours.  SURGICAL WAITING ROOM VISITATION Patients having surgery or a procedure may have two support  people in the waiting area. Visitors may stay in the waiting area during the procedure and switch out with other visitors if needed. Children under the age of 25 must have an adult accompany them who is not the patient. If the patient needs to stay at the hospital during part of their recovery, the visitor guidelines for inpatient rooms apply.  Please refer to the [facility] website for the visitor guidelines for Inpatients (after your surgery is over and you are in a regular room).    Special instructions:   Sierra [name]- Preparing For Surgery  Before surgery, you can play an important role. Because skin is not sterile, your skin needs to be as free of germs as possible. You can reduce the number of germs on your skin by washing with CHG (chlorahexidine gluconate) Soap before surgery.  CHG is an antiseptic cleaner which kills germs and bonds with the skin to continue killing germs even after washing.    Oral Hygiene is also important to reduce your risk of infection.  Remember - BRUSH YOUR TEETH THE MORNING OF SURGERY WITH YOUR REGULAR TOOTHPASTE  Please do not use if you have an allergy to CHG or antibacterial soaps. If your skin becomes reddened/irritated stop using the CHG.  Do not shave (including legs and underarms) for at least 48 hours prior to first CHG shower. It is OK to shave your face.  Please follow these instructions carefully.   Shower the [employer] SURGERY and  the MORNING OF SURGERY  If you chose to wash your hair, wash your hair first as usual with your normal shampoo.  After you shampoo, rinse your hair and body thoroughly to remove the shampoo.  Use CHG Soap as you would any other liquid soap. You can apply CHG directly to the skin and wash gently with a scrungie or a clean washcloth.   Apply the CHG Soap to your body ONLY FROM THE NECK DOWN.  Do not use on open wounds or open sores. Avoid contact with your eyes, ears, mouth and genitals (private parts). Wash Face  and genitals (private parts)  with your normal soap.   Wash thoroughly, paying special attention to the area where your surgery will be performed.  Thoroughly rinse your body with warm water from the neck down.  DO NOT shower/wash with your normal soap after using and rinsing off the CHG Soap.  Pat yourself dry with a CLEAN TOWEL.  Wear CLEAN PAJAMAS to bed the night before surgery  Place CLEAN SHEETS on your bed the night before your surgery  DO NOT SLEEP WITH PETS.   Day of Surgery: Take a shower with CHG soap. Do not wear jewelry or makeup. Do not wear lotions, powders, perfumes/colognes, or deodorant. Do not shave 48 hours prior to surgery. Do not bring valuables to the hospital.  [employer] [employer] is not responsible for any belongings or valuables. Do not wear nail polish, gel polish, artificial nails, or any other type of covering on natural nails (fingers and toes) If you have artificial nails or gel coating that need to be removed by a nail salon, please have this removed prior to surgery. Artificial nails or gel coating may interfere with anesthesia's ability to adequately monitor your vital signs. Wear Clean/Comfortable clothing the morning of surgery Do not apply any deodorants/lotions.   Remember to brush your teeth WITH YOUR REGULAR TOOTHPASTE.   Please read over the following fact sheets that you were given.  If you received a COVID test during your pre-op visit  it is requested that you wear a mask when out in public, stay away from anyone that may not be feeling well and notify your surgeon if you develop symptoms. If you have been in contact with anyone that has tested positive in the last 10 days please notify you surgeon.

## 2022-07-07 ENCOUNTER — Other Ambulatory Visit: Payer: Self-pay

## 2022-07-07 ENCOUNTER — Encounter (HOSPITAL_COMMUNITY)
Admission: RE | Admit: 2022-07-07 | Discharge: 2022-07-07 | Disposition: A | Discharge status: Home / Self Care | Payer: Federal, State, Local not specified - PPO | Source: Ambulatory Visit | Attending: Surgery | Admitting: Surgery

## 2022-07-07 ENCOUNTER — Encounter (HOSPITAL_COMMUNITY): Payer: Self-pay

## 2022-07-07 VITALS — BP 141/63 | HR 63 | Temp 97.8°F | Resp 17 | Ht 62.0 in | Wt 131.7 lb

## 2022-07-07 DIAGNOSIS — I10 Essential (primary) hypertension: Secondary | ICD-10-CM | POA: Insufficient documentation

## 2022-07-07 DIAGNOSIS — I4510 Unspecified right bundle-branch block: Secondary | ICD-10-CM | POA: Insufficient documentation

## 2022-07-07 DIAGNOSIS — Z01818 Encounter for other preprocedural examination: Secondary | ICD-10-CM

## 2022-07-07 DIAGNOSIS — R001 Bradycardia, unspecified: Secondary | ICD-10-CM | POA: Insufficient documentation

## 2022-07-07 LAB — BASIC METABOLIC PANEL
Anion gap: 6 (ref 5–15)
BUN: 20 mg/dL (ref 8–23)
CO2: 27 mmol/L (ref 22–32)
Calcium: 10 mg/dL (ref 8.9–10.3)
Chloride: 106 mmol/L (ref 98–111)
Creatinine, Ser: 1.1 mg/dL — ABNORMAL HIGH (ref 0.44–1.00)
GFR, Estimated: 50 mL/min — ABNORMAL LOW (ref >60)
Glucose, Bld: 97 mg/dL (ref 70–99)
Potassium: 4.3 mmol/L (ref 3.5–5.1)
Sodium: 139 mmol/L (ref 135–145)

## 2022-07-07 LAB — CBC
HCT: 40.1 % (ref 36.0–46.0)
Hemoglobin: 13.4 g/dL (ref 12.0–15.0)
MCH: 30.7 pg (ref 26.0–34.0)
MCHC: 33.4 g/dL (ref 30.0–36.0)
MCV: 91.8 fL (ref 80.0–100.0)
Platelets: 193 10*3/uL (ref 150–400)
RBC: 4.37 MIL/uL (ref 3.87–5.11)
RDW: 12.9 % (ref 11.5–15.5)
WBC: 5.6 10*3/uL (ref 4.0–10.5)
nRBC: 0 % (ref 0.0–0.2)

## 2022-07-07 NOTE — Progress Notes (Signed)
PCP - Dr. Burnard Bunting Cardiologist - Denies  PPM/ICD - Denies  Chest x-ray - N/A EKG - 07/07/22 Stress Test - Denies ECHO - 09/01/15 Cardiac Cath - Denies  Sleep Study - Denies  Diabetes: Denies  Blood Thinner Instructions: N/A Aspirin Instructions: Follow surgeon's instructions.  ERAS Protcol - Yes PRE-SURGERY Ensure or G2- No  COVID TEST- N/A   Anesthesia review: Yes, abnormal EKG  Patient denies shortness of breath, fever, cough and chest pain at PAT appointment   All instructions explained to the patient, with a verbal understanding of the material. Patient agrees to go over the instructions while at home for a better understanding. Patient also instructed to self quarantine after being tested for COVID-19. The opportunity to ask questions was provided.

## 2022-07-12 NOTE — Anesthesia Preprocedure Evaluation (Addendum)
Anesthesia Evaluation  Patient identified by MRN, date of birth, ID band Patient awake    Reviewed: Allergy & Precautions, NPO status , Patient's Chart, lab work & pertinent test results  History of Anesthesia Complications (+) PONV and history of anesthetic complications  Airway Mallampati: II  TM Distance: >3 FB Neck ROM: Full    Dental no notable dental hx.    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)  negative genitourinary   Musculoskeletal negative musculoskeletal ROS (+)    Abdominal   Peds  Hematology negative hematology ROS (+)   Anesthesia Other Findings Right breast ca  Reproductive/Obstetrics negative OB ROS                             Anesthesia Physical Anesthesia Plan  ASA: 2  Anesthesia Plan: General   Post-op Pain Management: Tylenol PO (pre-op)*   Induction: Intravenous  PONV Risk Score and Plan: 4 or greater and Treatment may vary due to age or medical condition, Ondansetron, Dexamethasone, Propofol infusion and TIVA  Airway Management Planned: LMA  Additional Equipment: None  Intra-op Plan:   Post-operative Plan: Extubation in OR  Informed Consent: I have reviewed the patients History and Physical, chart, labs and discussed the procedure including the risks, benefits and alternatives for the proposed anesthesia with the patient or authorized representative who has indicated his/her understanding and acceptance.     Dental advisory given  Plan Discussed with: CRNA  Anesthesia Plan Comments:         Anesthesia Quick Evaluation

## 2022-07-13 ENCOUNTER — Encounter (HOSPITAL_COMMUNITY): Payer: Self-pay | Admitting: Surgery

## 2022-07-13 ENCOUNTER — Ambulatory Visit (HOSPITAL_COMMUNITY): Payer: Federal, State, Local not specified - PPO | Admitting: Physician Assistant

## 2022-07-13 ENCOUNTER — Encounter (HOSPITAL_COMMUNITY)
Admission: RE | Disposition: A | Discharge status: Home / Self Care | Payer: Self-pay | Source: Home / Self Care | Attending: Surgery

## 2022-07-13 ENCOUNTER — Ambulatory Visit (HOSPITAL_COMMUNITY)
Admission: RE | Admit: 2022-07-13 | Discharge: 2022-07-13 | Disposition: A | Discharge status: Home / Self Care | Payer: Federal, State, Local not specified - PPO | Attending: Surgery | Admitting: Surgery

## 2022-07-13 ENCOUNTER — Other Ambulatory Visit: Payer: Self-pay

## 2022-07-13 ENCOUNTER — Ambulatory Visit (HOSPITAL_COMMUNITY): Payer: Federal, State, Local not specified - PPO | Admitting: Anesthesiology

## 2022-07-13 DIAGNOSIS — N289 Disorder of kidney and ureter, unspecified: Secondary | ICD-10-CM | POA: Diagnosis not present

## 2022-07-13 DIAGNOSIS — Z8673 Personal history of transient ischemic attack (TIA), and cerebral infarction without residual deficits: Secondary | ICD-10-CM | POA: Diagnosis not present

## 2022-07-13 DIAGNOSIS — D0501 Lobular carcinoma in situ of right breast: Secondary | ICD-10-CM | POA: Insufficient documentation

## 2022-07-13 DIAGNOSIS — I10 Essential (primary) hypertension: Secondary | ICD-10-CM | POA: Diagnosis not present

## 2022-07-13 DIAGNOSIS — C50911 Malignant neoplasm of unspecified site of right female breast: Secondary | ICD-10-CM | POA: Diagnosis present

## 2022-07-13 HISTORY — PX: BREAST LUMPECTOMY WITH RADIOACTIVE SEED LOCALIZATION: SHX6424

## 2022-07-13 SURGERY — BREAST LUMPECTOMY WITH RADIOACTIVE SEED LOCALIZATION
Anesthesia: General | Site: Breast | Laterality: Right

## 2022-07-13 MED ORDER — OXYCODONE HCL 5 MG/5ML PO SOLN: 5.0000 mg | Freq: Once | ORAL | Status: DC | PRN

## 2022-07-13 MED ORDER — CHLORHEXIDINE GLUCONATE CLOTH 2 % EX PADS: 6.0000 | MEDICATED_PAD | Freq: Once | CUTANEOUS | Status: DC

## 2022-07-13 MED ORDER — CEFAZOLIN SODIUM-DEXTROSE 2-4 GM/100ML-% IV SOLN
INTRAVENOUS | Status: AC
  Filled 2022-07-13: qty 100

## 2022-07-13 MED ORDER — 0.9 % SODIUM CHLORIDE (POUR BTL) OPTIME
TOPICAL | Status: DC | PRN
  Administered 2022-07-13: 500 mL

## 2022-07-13 MED ORDER — LACTATED RINGERS IV SOLN: INTRAVENOUS | Status: DC

## 2022-07-13 MED ORDER — ACETAMINOPHEN 500 MG PO TABS: 1000.0000 mg | ORAL_TABLET | Freq: Once | ORAL | Status: DC

## 2022-07-13 MED ORDER — BUPIVACAINE-EPINEPHRINE 0.25% -1:200000 IJ SOLN
INTRAMUSCULAR | Status: DC | PRN
  Administered 2022-07-13: 10 mL

## 2022-07-13 MED ORDER — PROPOFOL 10 MG/ML IV BOLUS
INTRAVENOUS | Status: DC | PRN
  Administered 2022-07-13: 40 mg via INTRAVENOUS
  Administered 2022-07-13: 90 mg via INTRAVENOUS
  Administered 2022-07-13: 20 mg via INTRAVENOUS

## 2022-07-13 MED ORDER — DEXAMETHASONE SODIUM PHOSPHATE 10 MG/ML IJ SOLN
INTRAMUSCULAR | Status: AC
  Filled 2022-07-13: qty 3

## 2022-07-13 MED ORDER — FENTANYL CITRATE (PF) 250 MCG/5ML IJ SOLN
INTRAMUSCULAR | Status: AC
  Filled 2022-07-13: qty 5

## 2022-07-13 MED ORDER — ACETAMINOPHEN 500 MG PO TABS
ORAL_TABLET | ORAL | Status: AC
  Administered 2022-07-13: 1000 mg via ORAL
  Filled 2022-07-13: qty 2

## 2022-07-13 MED ORDER — CHLORHEXIDINE GLUCONATE 0.12 % MT SOLN
OROMUCOSAL | Status: AC
  Administered 2022-07-13: 15 mL via OROMUCOSAL
  Filled 2022-07-13: qty 15

## 2022-07-13 MED ORDER — ORAL CARE MOUTH RINSE: 15.0000 mL | Freq: Once | OROMUCOSAL | Status: AC

## 2022-07-13 MED ORDER — ROCURONIUM BROMIDE 10 MG/ML (PF) SYRINGE
PREFILLED_SYRINGE | INTRAVENOUS | Status: AC
  Filled 2022-07-13: qty 10

## 2022-07-13 MED ORDER — DEXAMETHASONE SODIUM PHOSPHATE 10 MG/ML IJ SOLN
INTRAMUSCULAR | Status: DC | PRN
  Administered 2022-07-13: 4 mg via INTRAVENOUS

## 2022-07-13 MED ORDER — PROPOFOL 500 MG/50ML IV EMUL
INTRAVENOUS | Status: DC | PRN
  Administered 2022-07-13: 150 ug/kg/min via INTRAVENOUS

## 2022-07-13 MED ORDER — CEFAZOLIN SODIUM-DEXTROSE 2-4 GM/100ML-% IV SOLN
2.0000 g | INTRAVENOUS | Status: AC
  Administered 2022-07-13: 2 g via INTRAVENOUS

## 2022-07-13 MED ORDER — OXYCODONE HCL 5 MG PO TABS: 5.0000 mg | ORAL_TABLET | Freq: Once | ORAL | Status: DC | PRN

## 2022-07-13 MED ORDER — EPHEDRINE SULFATE-NACL 50-0.9 MG/10ML-% IV SOSY
PREFILLED_SYRINGE | INTRAVENOUS | Status: DC | PRN
  Administered 2022-07-13: 5 mg via INTRAVENOUS
  Administered 2022-07-13: 10 mg via INTRAVENOUS

## 2022-07-13 MED ORDER — CEFAZOLIN SODIUM 1 G IJ SOLR
INTRAMUSCULAR | Status: AC
  Filled 2022-07-13: qty 30

## 2022-07-13 MED ORDER — LIDOCAINE 2% (20 MG/ML) 5 ML SYRINGE
INTRAMUSCULAR | Status: DC | PRN
  Administered 2022-07-13: 60 mg via INTRAVENOUS

## 2022-07-13 MED ORDER — PROPOFOL 10 MG/ML IV BOLUS
INTRAVENOUS | Status: AC
  Filled 2022-07-13: qty 20

## 2022-07-13 MED ORDER — FENTANYL CITRATE (PF) 250 MCG/5ML IJ SOLN
INTRAMUSCULAR | Status: DC | PRN
  Administered 2022-07-13: 25 ug via INTRAVENOUS

## 2022-07-13 MED ORDER — ACETAMINOPHEN 500 MG PO TABS: 1000.0000 mg | ORAL_TABLET | ORAL | Status: AC

## 2022-07-13 MED ORDER — EPHEDRINE 5 MG/ML INJ
INTRAVENOUS | Status: AC
  Filled 2022-07-13: qty 5

## 2022-07-13 MED ORDER — CHLORHEXIDINE GLUCONATE 0.12 % MT SOLN: 15.0000 mL | Freq: Once | OROMUCOSAL | Status: AC

## 2022-07-13 MED ORDER — BUPIVACAINE-EPINEPHRINE (PF) 0.25% -1:200000 IJ SOLN
INTRAMUSCULAR | Status: AC
  Filled 2022-07-13: qty 30

## 2022-07-13 MED ORDER — SUCCINYLCHOLINE CHLORIDE 200 MG/10ML IV SOSY
PREFILLED_SYRINGE | INTRAVENOUS | Status: AC
  Filled 2022-07-13: qty 10

## 2022-07-13 MED ORDER — PHENYLEPHRINE 80 MCG/ML (10ML) SYRINGE FOR IV PUSH (FOR BLOOD PRESSURE SUPPORT)
PREFILLED_SYRINGE | INTRAVENOUS | Status: AC
  Filled 2022-07-13: qty 10

## 2022-07-13 MED ORDER — ONDANSETRON HCL 4 MG/2ML IJ SOLN
INTRAMUSCULAR | Status: DC | PRN
  Administered 2022-07-13: 4 mg via INTRAVENOUS

## 2022-07-13 MED ORDER — FENTANYL CITRATE (PF) 100 MCG/2ML IJ SOLN: 25.0000 ug | INTRAMUSCULAR | Status: DC | PRN

## 2022-07-13 MED ORDER — LIDOCAINE 2% (20 MG/ML) 5 ML SYRINGE
INTRAMUSCULAR | Status: AC
  Filled 2022-07-13: qty 15

## 2022-07-13 MED ORDER — ONDANSETRON HCL 4 MG/2ML IJ SOLN
INTRAMUSCULAR | Status: AC
  Filled 2022-07-13: qty 6

## 2022-07-13 SURGICAL SUPPLY — 38 items
APPLIER CLIP 9.375 MED OPEN (MISCELLANEOUS)
BAG COUNTER SPONGE SURGICOUNT (BAG) ×1 IMPLANT
BENZOIN TINCTURE PRP APPL 2/3 (GAUZE/BANDAGES/DRESSINGS) ×1 IMPLANT
BINDER BREAST LRG (GAUZE/BANDAGES/DRESSINGS) IMPLANT
BINDER BREAST XLRG (GAUZE/BANDAGES/DRESSINGS) IMPLANT
CANISTER SUCT 3000ML PPV (MISCELLANEOUS) ×1 IMPLANT
CHLORAPREP W/TINT 26 (MISCELLANEOUS) ×1 IMPLANT
CLIP APPLIE 9.375 MED OPEN (MISCELLANEOUS) IMPLANT
COVER PROBE W GEL 5X96 (DRAPES) ×1 IMPLANT
COVER SURGICAL LIGHT HANDLE (MISCELLANEOUS) ×1 IMPLANT
DEVICE DUBIN SPECIMEN MAMMOGRA (MISCELLANEOUS) ×1 IMPLANT
DRAPE CHEST BREAST 15X10 FENES (DRAPES) ×1 IMPLANT
DRSG TEGADERM 4X4.75 (GAUZE/BANDAGES/DRESSINGS) ×1 IMPLANT
ELECT CAUTERY BLADE 6.4 (BLADE) ×1 IMPLANT
ELECT REM PT RETURN 9FT ADLT (ELECTROSURGICAL) ×1
ELECTRODE REM PT RTRN 9FT ADLT (ELECTROSURGICAL) ×1 IMPLANT
GAUZE SPONGE 2X2 8PLY STRL LF (GAUZE/BANDAGES/DRESSINGS) ×1 IMPLANT
GLOVE BIO SURGEON STRL SZ7 (GLOVE) ×1 IMPLANT
GLOVE BIOGEL PI IND STRL 7.5 (GLOVE) ×1 IMPLANT
GOWN STRL REUS W/ TWL LRG LVL3 (GOWN DISPOSABLE) ×2 IMPLANT
GOWN STRL REUS W/TWL LRG LVL3 (GOWN DISPOSABLE) ×4
ILLUMINATOR WAVEGUIDE N/F (MISCELLANEOUS) IMPLANT
KIT BASIN OR (CUSTOM PROCEDURE TRAY) ×1 IMPLANT
KIT MARKER MARGIN INK (KITS) ×1 IMPLANT
LIGHT WAVEGUIDE WIDE FLAT (MISCELLANEOUS) IMPLANT
NDL HYPO 25GX1X1/2 BEV (NEEDLE) ×1 IMPLANT
NEEDLE HYPO 25GX1X1/2 BEV (NEEDLE) ×1 IMPLANT
NS IRRIG 1000ML POUR BTL (IV SOLUTION) ×1 IMPLANT
PACK GENERAL/GYN (CUSTOM PROCEDURE TRAY) ×1 IMPLANT
SPONGE T-LAP 4X18 ~~LOC~~+RFID (SPONGE) ×1 IMPLANT
STRIP CLOSURE SKIN 1/2X4 (GAUZE/BANDAGES/DRESSINGS) ×1 IMPLANT
SUT MNCRL AB 4-0 PS2 18 (SUTURE) ×1 IMPLANT
SUT SILK 2 0 SH (SUTURE) IMPLANT
SUT VIC AB 3-0 SH 27 (SUTURE) ×1
SUT VIC AB 3-0 SH 27X BRD (SUTURE) ×1 IMPLANT
SYR CONTROL 10ML LL (SYRINGE) ×1 IMPLANT
TOWEL GREEN STERILE (TOWEL DISPOSABLE) ×1 IMPLANT
TOWEL GREEN STERILE FF (TOWEL DISPOSABLE) ×1 IMPLANT

## 2022-07-13 NOTE — Discharge Instructions (Signed)
[facility] Office Phone Number [phone]  BREAST BIOPSY/ PARTIAL MASTECTOMY: POST OP INSTRUCTIONS  Always review your discharge instruction sheet given to you by the facility where your surgery was performed.  IF YOU HAVE DISABILITY OR FAMILY LEAVE FORMS, YOU MUST BRING THEM TO THE OFFICE FOR PROCESSING.  DO NOT GIVE THEM TO YOUR DOCTOR.  A prescription for pain medication may be given to you upon discharge.  Take your pain medication as prescribed, if needed.  If narcotic pain medicine is not needed, then you may take acetaminophen (Tylenol) or ibuprofen (Advil) as needed. Take your usually prescribed medications unless otherwise directed If you need a refill on your pain medication, please contact your pharmacy.  They will contact our office to request authorization.  Prescriptions will not be filled after 5pm or on week-ends. You should eat very light the first 24 hours after surgery, such as soup, crackers, pudding, etc.  Resume your normal diet the day after surgery. Most patients will experience some swelling and bruising in the breast.  Ice packs and a good support bra will help.  Swelling and bruising can take several days to resolve.  It is common to experience some constipation if taking pain medication after surgery.  Increasing fluid intake and taking a stool softener will usually help or prevent this problem from occurring.  A mild laxative (Milk of Magnesia or Miralax) should be taken according to package directions if there are no bowel movements after 48 hours. Unless discharge instructions indicate otherwise, you may remove your bandages 24-48 hours after surgery, and you may shower at that time.  You may have steri-strips (small skin tapes) in place directly over the incision.  These strips should be left on the skin for 7-10 days.  If your surgeon used skin glue on the incision, you may shower in 24 hours.  The glue will flake off over the next 2-3 weeks.  Any  sutures or staples will be removed at the office during your follow-up visit. ACTIVITIES:  You may resume regular daily activities (gradually increasing) beginning the next day.  Wearing a good support bra or sports bra minimizes pain and swelling.  You may have sexual intercourse when it is comfortable. You may drive when you no longer are taking prescription pain medication, you can comfortably wear a seatbelt, and you can safely maneuver your car and apply brakes. RETURN TO WORK:  ______________________________________________________________________________________ You should see your doctor in the office for a follow-up appointment approximately two weeks after your surgery.  Your doctor's nurse will typically make your follow-up appointment when she calls you with your pathology report.  Expect your pathology report 2-3 business days after your surgery.  You may call to check if you do not hear from us after three days. OTHER INSTRUCTIONS: _______________________________________________________________________________________________ _____________________________________________________________________________________________________________________________________ _____________________________________________________________________________________________________________________________________ _____________________________________________________________________________________________________________________________________  WHEN TO CALL YOUR DOCTOR: Fever over 101.0 Nausea and/or vomiting. Extreme swelling or bruising. Continued bleeding from incision. Increased pain, redness, or drainage from the incision.  The clinic staff is available to answer your questions during regular business hours.  Please don't hesitate to call and ask to speak to one of the nurses for clinical concerns.  If you have a medical emergency, go to the nearest emergency room or call 911.  A surgeon from [facility] is always on call at the hospital.  For further questions, please visit [email or web address]   

## 2022-07-13 NOTE — Anesthesia Postprocedure Evaluation (Signed)
Anesthesia Post Note  Patient: Sierra [name]  Procedure(s) Performed: RIGHT BREAST LUMPECTOMY WITH RADIOACTIVE SEED LOCALIZATION (Right: Breast)     Patient location during evaluation: PACU Anesthesia Type: General Level of consciousness: awake and alert Pain management: pain level controlled Vital Signs Assessment: post-procedure vital signs reviewed and stable Respiratory status: spontaneous breathing, nonlabored ventilation and respiratory function stable Cardiovascular status: blood pressure returned to baseline Postop Assessment: no apparent nausea or vomiting Anesthetic complications: no   No notable events documented.  Last Vitals:  Vitals:   07/13/22 1225 07/13/22 1240  BP: 117/87 (!) 126/98  Pulse: 62 (!) 57  Resp: 16 14  Temp:  36.4 C  SpO2: 95% 97%    Last Pain:  Vitals:   07/13/22 1240  TempSrc:   PainSc: 0-No pain                 Marthenia Rolling

## 2022-07-13 NOTE — Transfer of Care (Signed)
Immediate Anesthesia Transfer of Care Note  Patient: Sierra [name]  Procedure(s) Performed: RIGHT BREAST LUMPECTOMY WITH RADIOACTIVE SEED LOCALIZATION (Right: Breast)  Patient Location: PACU  Anesthesia Type:General  Level of Consciousness: awake, alert  and oriented  Airway & Oxygen Therapy: Patient Spontanous Breathing  Post-op Assessment: Report given to RN and Post -op Vital signs reviewed and stable  Post vital signs: Reviewed and stable  Last Vitals:  Vitals Value Taken Time  BP 122/65 07/13/22 1211  Temp    Pulse 66 07/13/22 1214  Resp    SpO2 97 % 07/13/22 1214  Vitals shown include unvalidated device data.  Last Pain:  Vitals:   07/13/22 0908  TempSrc:   PainSc: 0-No pain         Complications: No notable events documented.

## 2022-07-13 NOTE — Op Note (Signed)
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 Resident:  Dr. Lossie Faes I was personally present during the key and critical portions of this procedure and immediately available throughout the entire procedure, as documented in my operative note. Anesthesia:  GEN - LMA Indications:  This is a relatively healthy 81 year old female who is status post right breast radioactive seed localized lumpectomy on 09/21/2020 which revealed atypical lobular hyperplasia.  The patient recently had a follow-up mammogram on 06/13/2022.  A mass was noted in the right upper outer quadrant close to the area of previous excision.  She underwent further work-up including diagnostic mammogram and biopsy.  This revealed a 0.8 x 0.8 x 0.4 cm mass in the right upper outer quadrant with calcifications.  Ultrasound was negative.  She underwent biopsy of this area which revealed pleomorphic/florid lobular carcinoma in situ with central necrosis and calcifications Description of procedure: The patient is brought to the operating room placed in supine position on the operating room table. After an adequate level of general anesthesia was obtained, her right breast was prepped with ChloraPrep and draped in sterile fashion. A timeout was taken to ensure the proper patient and proper procedure. We interrogated the breast with the neoprobe. We made a transverse incision lateral to the nipple after infiltrating with 0.25% Marcaine. Dissection was carried down in the breast tissue with cautery. We used the neoprobe to guide [place] towards the radioactive seed. We excised an area of tissue around the radioactive seed 2 cm in diameter. The specimen was removed and was oriented with a paint kit. Interrogation of the wound showed that the seed remained just lateral to our first specimen.  We excised another 1.5 cm area of  breast tissue around the seed. Specimen mammogram showed the radioactive seed within the specimen. This was sent for pathologic examination. The initial specimen was also sent as "right breast mass - Anterior-Medial".  There is no residual radioactivity within the biopsy cavity. We inspected carefully for hemostasis. The wound was thoroughly irrigated. The wound was closed with a deep layer of 3-0 Vicryl and a subcuticular layer of 4-0 Monocryl. Benzoin Steri-Strips were applied. The patient was then extubated and brought to the recovery room in stable condition. All sponge, instrument, and needle counts are correct.  Imogene Burn. Georgette Dover, MD, [facility]  General/ Trauma Surgery  07/13/2022 12:01 PM

## 2022-07-13 NOTE — Anesthesia Procedure Notes (Signed)
Procedure Name: LMA Insertion Date/Time: 07/13/2022 11:26 AM  Performed by: Griffin Dakin, CRNAPre-anesthesia Checklist: Patient identified, Emergency Drugs available, Patient being monitored and Suction available Patient Re-evaluated:Patient Re-evaluated prior to induction Oxygen Delivery Method: Circle system utilized Preoxygenation: Pre-oxygenation with 100% oxygen Induction Type: IV induction LMA: LMA inserted LMA Size: 3.0 Number of attempts: 1 Placement Confirmation: positive ETCO2 and breath sounds checked- equal and bilateral Tube secured with: Tape Dental Injury: Teeth and Oropharynx as per pre-operative assessment

## 2022-07-13 NOTE — Interval H&P Note (Signed)
History and Physical Interval Note:  07/13/2022 8:46 AM  Sierra [name]  has presented today for surgery, with the diagnosis of RIGHT BREAST PLEOMORPHIC LOBULAR CARCINOMA IN SITU.  The various methods of treatment have been discussed with the patient and family. After consideration of risks, benefits and other options for treatment, the patient has consented to  Procedure(s): RIGHT BREAST LUMPECTOMY WITH RADIOACTIVE SEED LOCALIZATION (Right) as a surgical intervention.  The patient's history has been reviewed, patient examined, no change in status, stable for surgery.  I have reviewed the patient's chart and labs.  Questions were answered to the patient's satisfaction.     Maia Petties

## 2022-07-14 ENCOUNTER — Encounter (HOSPITAL_COMMUNITY): Payer: Self-pay | Admitting: Surgery

## 2022-07-17 LAB — SURGICAL PATHOLOGY

## 2022-07-18 ENCOUNTER — Encounter (HOSPITAL_COMMUNITY): Payer: Self-pay

## 2022-07-20 ENCOUNTER — Telehealth: Payer: Self-pay | Admitting: Hematology and Oncology

## 2022-07-20 NOTE — Telephone Encounter (Signed)
Scheduled appt per 10/12 referral. Pt is aware of appt date and time. Pt is aware to arrive 15 mins prior to appt time and to bring and updated insurance card. Pt is aware of appt location.   

## 2022-08-01 ENCOUNTER — Encounter: Payer: Self-pay | Admitting: Hematology and Oncology

## 2022-08-01 ENCOUNTER — Other Ambulatory Visit: Payer: Self-pay

## 2022-08-01 ENCOUNTER — Inpatient Hospital Stay
Payer: Federal, State, Local not specified - PPO | Attending: Hematology and Oncology | Admitting: Hematology and Oncology

## 2022-08-01 DIAGNOSIS — I129 Hypertensive chronic kidney disease with stage 1 through stage 4 chronic kidney disease, or unspecified chronic kidney disease: Secondary | ICD-10-CM | POA: Diagnosis not present

## 2022-08-01 DIAGNOSIS — D0501 Lobular carcinoma in situ of right breast: Secondary | ICD-10-CM | POA: Diagnosis not present

## 2022-08-01 DIAGNOSIS — N189 Chronic kidney disease, unspecified: Secondary | ICD-10-CM | POA: Insufficient documentation

## 2022-08-01 DIAGNOSIS — D0511 Intraductal carcinoma in situ of right breast: Secondary | ICD-10-CM | POA: Insufficient documentation

## 2022-08-01 NOTE — Progress Notes (Signed)
[facility] NOTE  Patient Care Team: Burnard Bunting, MD as PCP - General (Internal Medicine)  CHIEF COMPLAINTS/PURPOSE OF CONSULTATION:  Newly diagnosed right breast DCIS  HISTORY OF PRESENTING ILLNESS:  Sierra [name] 81 y.o. female is here because of recent diagnosis of right breast LCIS.  Patient had a routine screening mammogram that detected a 0.8 cm abnormality of calcifications which on further investigation biopsy came back as LCIS.  She underwent lumpectomy of the right breast which confirmed the LCIS.  There was no evidence of invasive breast cancer.  LCIS was ER/PR negative.  She was referred to [place] for discussion regarding adjuvant treatment options.  I reviewed her records extensively and collaborated the history with the patient.     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 08/01/2022   PONV (postoperative nausea and vomiting)    extreme has put her in the hospital   Vitamin B12 deficiency 04/27/2021    SURGICAL HISTORY: Past Surgical History:  Procedure Laterality Date   BREAST LUMPECTOMY WITH RADIOACTIVE SEED LOCALIZATION Right 09/21/2020   Procedure: RIGHT BREAST LUMPECTOMY WITH RADIOACTIVE SEED LOCALIZATION;  Surgeon: Donnie Mesa, MD;  Location: [place];  Service: General;  Laterality: Right;   BREAST LUMPECTOMY WITH RADIOACTIVE SEED LOCALIZATION Right 07/13/2022   Procedure: RIGHT BREAST LUMPECTOMY WITH RADIOACTIVE SEED LOCALIZATION;  Surgeon: Donnie Mesa, MD;  Location: [place];  Service: General;  Laterality: Right;   CATARACT EXTRACTION, BILATERAL Bilateral L 03-27-16  R 04-24-16   COLONOSCOPY      screenin gonly benign polyps removed   NEPHRECTOMY  1963   left   TUBAL LIGATION  1975    SOCIAL HISTORY: Social History   Socioeconomic History   Marital status: Divorced    Spouse name: Not on file   Number of children: 2   Years of education: 13   Highest education level: Not on file  Occupational History   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   Patient is left handed.    Social Determinants of Health   Financial Resource Strain: Not on file  Food Insecurity: Not on file  Transportation Needs: Not on file  Physical Activity: Not on file  Stress: Not on file  Social Connections: Not on file  Intimate Partner Violence: Not on file    FAMILY HISTORY: Family History  Problem Relation Age of Onset   Thyroid disease Mother    Dementia Father    Colon cancer Neg Hx    Esophageal cancer Neg Hx    Stomach cancer Neg Hx    Rectal cancer Neg Hx  ALLERGIES:  has No Known Allergies.  MEDICATIONS:  Current Outpatient Medications  Medication Sig Dispense Refill   acetaminophen (TYLENOL) 500 MG tablet Take 500 mg by mouth every 6 (six) hours as needed for mild pain or headache.     aspirin 81 MG chewable tablet Chew 81 mg by mouth daily.     atorvastatin (LIPITOR) 40 MG tablet Take 40 mg by mouth daily at 6 PM.      Calcium Carb-Cholecalciferol (CALCIUM + D3) 600-200 MG-UNIT TABS Take 1 tablet by mouth 2 (two) times daily.     Coenzyme Q10 (CO Q 10 PO) Take 200 mg by mouth daily.     docusate sodium (COLACE) 100 MG capsule Take 100 mg by mouth daily.      levothyroxine (SYNTHROID) 50 MCG tablet Take 50 mcg by mouth every morning.     Lysine 500 MG TABS Take 500 mg by mouth daily.     Omega-3 Fatty Acids (FISH OIL) 1000 MG CAPS Take 1,000 mg by mouth daily.     polyvinyl alcohol (LIQUIFILM TEARS) 1.4 % ophthalmic solution Place 1 drop into both eyes 3 (three) times daily. [employer] brand     propranolol ER (INDERAL LA) 120 MG 24 hr capsule TAKE 1 CAPSULE(120 MG) BY MOUTH DAILY 90 capsule 2   SUMAtriptan (IMITREX) 6 MG/0.5ML SOLN injection Inject 6 mg into the skin every 2 (two) hours as needed for migraine or headache. May repeat in 2 hours if headache persists or recurs.     triamterene-hydrochlorothiazide (MAXZIDE-25) 37.5-25 MG tablet Take 1 tablet by mouth at bedtime.     TURMERIC PO Take 1 capsule by mouth daily.     vitamin B-12 (CYANOCOBALAMIN) 1000 MCG tablet Take 1,000 mcg by mouth daily.     No current facility-administered medications for this visit.    REVIEW OF SYSTEMS:   Constitutional: Denies fevers, chills or abnormal night sweats   All other systems were reviewed with the patient and are negative.  PHYSICAL EXAMINATION: ECOG PERFORMANCE STATUS: 0 - Asymptomatic  Vitals:   08/01/22 1303  BP: (!) 143/81  Pulse: (!) 59  Resp: 16  Temp: (!) 97.5 F (36.4 C)  SpO2: 96%   Filed Weights   08/01/22 1303  Weight: 133 lb 8 oz (60.6 kg)    GENERAL:alert, no distress and comfortable    LABORATORY DATA:  I have reviewed the data as listed Lab Results  Component Value Date   WBC 5.6 07/07/2022   HGB 13.4 07/07/2022   HCT 40.1 07/07/2022   MCV 91.8 07/07/2022   PLT 193 07/07/2022   Lab Results  Component Value Date   NA 139 07/07/2022   K 4.3 07/07/2022   CL 106 07/07/2022   CO2 27 07/07/2022    RADIOGRAPHIC STUDIES: I have personally reviewed the radiological reports and agreed with the findings in the report.  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 LCIS is a risk factor for breast cancer and not a premalignant condition. Hence it does not need to be resected. However she is at higher than normal risk of breast cancer. Risk of invasive and noninvasive breast cancer with LCIS is 1% per year. Her cumulative risk lifetime would be around 5%.    Risk reduction strategies: 1. Tamoxifen or raloxifene would  not have any significant benefit because she is ER/PR negative.. 2. Recommended annual mammograms and breast exams.   Patient was in phenomenal physical health and her life expectancy is likely to be closer to 100. Return to clinic on an as-needed basis.  All questions were answered. The patient knows to call the clinic with any problems, questions or concerns.    Harriette Ohara, MD 08/01/22

## 2022-08-01 NOTE — Assessment & Plan Note (Addendum)
07/13/2022: Right lumpectomy: Residual pleomorphic LCIS 7 mm, margins negative, ER 0%, PR 0%  Pathology counseling: I discussed with her the difference between LCIS and invasive cancer. LCIS is a risk factor for breast cancer and not a premalignant condition. Hence it does not need to be resected. However she is at higher than normal risk of breast cancer. Risk of invasive and noninvasive breast cancer with LCIS is 1% per year. Her cumulative risk lifetime would be around 5%.    Risk reduction strategies: 1. Tamoxifen or raloxifene would not have any significant benefit because she is ER/PR negative.. 2. Recommended annual mammograms and breast exams.   Return to clinic on an as-needed basis.

## 2023-01-18 ENCOUNTER — Encounter (HOSPITAL_COMMUNITY): Payer: Self-pay

## 2023-01-18 ENCOUNTER — Emergency Department (HOSPITAL_COMMUNITY): Payer: Federal, State, Local not specified - PPO

## 2023-01-18 ENCOUNTER — Emergency Department (HOSPITAL_COMMUNITY)
Admission: EM | Admit: 2023-01-18 | Discharge: 2023-01-18 | Disposition: A | Discharge status: Home / Self Care | Payer: Federal, State, Local not specified - PPO | Attending: Emergency Medicine | Admitting: Emergency Medicine

## 2023-01-18 ENCOUNTER — Other Ambulatory Visit: Payer: Self-pay

## 2023-01-18 DIAGNOSIS — Z7982 Long term (current) use of aspirin: Secondary | ICD-10-CM | POA: Insufficient documentation

## 2023-01-18 DIAGNOSIS — R200 Anesthesia of skin: Secondary | ICD-10-CM | POA: Diagnosis present

## 2023-01-18 DIAGNOSIS — G43109 Migraine with aura, not intractable, without status migrainosus: Secondary | ICD-10-CM | POA: Diagnosis not present

## 2023-01-18 DIAGNOSIS — I609 Nontraumatic subarachnoid hemorrhage, unspecified: Secondary | ICD-10-CM | POA: Insufficient documentation

## 2023-01-18 LAB — COMPREHENSIVE METABOLIC PANEL
ALT: 16 U/L (ref 0–44)
AST: 23 U/L (ref 15–41)
Albumin: 3.7 g/dL (ref 3.5–5.0)
Alkaline Phosphatase: 47 U/L (ref 38–126)
Anion gap: 19 — ABNORMAL HIGH (ref 5–15)
BUN: 26 mg/dL — ABNORMAL HIGH (ref 8–23)
CO2: 25 mmol/L (ref 22–32)
Calcium: 9.6 mg/dL (ref 8.9–10.3)
Chloride: 96 mmol/L — ABNORMAL LOW (ref 98–111)
Creatinine, Ser: 0.99 mg/dL (ref 0.44–1.00)
GFR, Estimated: 57 mL/min — ABNORMAL LOW (ref >60)
Glucose, Bld: 99 mg/dL (ref 70–99)
Potassium: 3.6 mmol/L (ref 3.5–5.1)
Sodium: 140 mmol/L (ref 135–145)
Total Bilirubin: 0.6 mg/dL (ref 0.3–1.2)
Total Protein: 6.3 g/dL — ABNORMAL LOW (ref 6.5–8.1)

## 2023-01-18 LAB — CBC
HCT: 38.8 % (ref 36.0–46.0)
Hemoglobin: 12.6 g/dL (ref 12.0–15.0)
MCH: 29.8 pg (ref 26.0–34.0)
MCHC: 32.5 g/dL (ref 30.0–36.0)
MCV: 91.7 fL (ref 80.0–100.0)
Platelets: 173 10*3/uL (ref 150–400)
RBC: 4.23 MIL/uL (ref 3.87–5.11)
RDW: 13.2 % (ref 11.5–15.5)
WBC: 5 10*3/uL (ref 4.0–10.5)
nRBC: 0 % (ref 0.0–0.2)

## 2023-01-18 MED ORDER — DIAZEPAM 5 MG/ML IJ SOLN
2.5000 mg | Freq: Once | INTRAMUSCULAR | Status: AC | PRN
  Administered 2023-01-18: 2.5 mg via INTRAVENOUS
  Filled 2023-01-18: qty 2

## 2023-01-18 MED ORDER — METOCLOPRAMIDE HCL 10 MG PO TABS: 5.0000 mg | ORAL_TABLET | Freq: Four times a day (QID) | ORAL | 0 refills | Status: AC | PRN

## 2023-01-18 MED ORDER — GADOBUTROL 1 MMOL/ML IV SOLN
6.0000 mL | Freq: Once | INTRAVENOUS | Status: AC | PRN
  Administered 2023-01-18: 6 mL via INTRAVENOUS

## 2023-01-18 MED ORDER — DIPHENHYDRAMINE HCL 50 MG/ML IJ SOLN
12.5000 mg | Freq: Once | INTRAMUSCULAR | Status: AC
  Administered 2023-01-18: 12.5 mg via INTRAVENOUS
  Filled 2023-01-18: qty 1

## 2023-01-18 MED ORDER — METOCLOPRAMIDE HCL 5 MG/ML IJ SOLN
5.0000 mg | Freq: Once | INTRAMUSCULAR | Status: AC
  Administered 2023-01-18: 5 mg via INTRAVENOUS
  Filled 2023-01-18: qty 2

## 2023-01-18 NOTE — ED Triage Notes (Signed)
Pt states numbness in bilateral hands and face since 0600 today, as well as slightly yesterday. Pt has hx of complicated migraines. Pt also had some facial drooping that has resolved per family- this was occurring during a numbness spell on right side of face.  Pt has 0 NIHSS in triage.

## 2023-01-18 NOTE — ED Provider Notes (Signed)
[name]   CSN: [id number] Arrival date & time: 01/18/23  1438     History  Chief Complaint  Patient presents with   Numbness    [name] [name] is a 82 y.o. female who presents with abnormal neurologic complaints. Patient has a pmh of atypical migraine equivalents. She [facility] but follow only w pcp. She takes preventive medication only, 100mg  pf propranalol daily. She does not atake abortive meds. The patient typically has 1-2 events a month but has had 3 days of multiple events daily. Typically she has paresthesia and numbness in the fingers and hands, Face, neck and tongue on ly on the right side. Today she has BL sxs.Her sxs come and go and only last for 15 min at a time. She denies headache, vision change, injury, blood thinners, fever, rash or neck stiffness. She denies weakness. Her daughter says that during one of her episodes she appeared the have R sided facial weakness and "thick tongue" with speech. Daughter states that this is why they came. The patient reports that she has had this in the past. She continues to have numnbness on the right side.  HPI     Home Medications Prior to Admission medications   Medication Sig Start Date End Date Taking? Authorizing Provider  acetaminophen (TYLENOL) 500 MG tablet Take 500 mg by mouth every 6 (six) hours as needed for mild pain or headache.    [provider]  aspirin 81 MG chewable tablet Chew 81 mg by mouth daily.    [provider]  atorvastatin (LIPITOR) 40 MG tablet Take 40 mg by mouth daily at 6 PM.  07/01/11   [provider]  Calcium Carb-Cholecalciferol (CALCIUM + D3) 600-200 MG-UNIT TABS Take 1 tablet by mouth 2 (two) times daily.    [provider]  Coenzyme Q10 (CO Q 10 PO) Take 200 mg by mouth daily.    [provider]  docusate sodium (COLACE) 100 MG capsule Take 100 mg  by mouth daily.    [provider]  levothyroxine (SYNTHROID) 50 MCG tablet Take 50 mcg by mouth every morning. 04/16/22   [provider]  Lysine 500 MG TABS Take 500 mg by mouth daily.    [provider]  Omega-3 Fatty Acids (FISH OIL) 1000 MG CAPS Take 1,000 mg by mouth daily.    [provider]  polyvinyl alcohol (LIQUIFILM TEARS) 1.4 % ophthalmic solution Place 1 drop into both eyes 3 (three) times daily. [employer] brand    [provider]  propranolol ER (INDERAL LA) 120 MG 24 hr capsule TAKE 1 CAPSULE(120 MG) BY MOUTH DAILY 12/20/20   York SpanielWillis, Charles K, MD  SUMAtriptan (IMITREX) 6 MG/0.5ML SOLN injection Inject 6 mg into the skin every 2 (two) hours as needed for migraine or headache. May repeat in 2 hours if headache persists or recurs.    [provider]  triamterene-hydrochlorothiazide (MAXZIDE-25) 37.5-25 MG tablet Take 1 tablet by mouth at bedtime. 09/06/15   Marinda ElkFeliz Ortiz, Abraham, MD  TURMERIC PO Take 1 capsule by mouth daily.    [provider]  vitamin B-12 (CYANOCOBALAMIN) 1000 MCG tablet Take 1,000 mcg by mouth daily.    [provider]      Allergies    Patient has no known allergies.    Review of Systems   Review of Systems  Physical Exam Updated Vital Signs BP Marland Kitchen(!)  143/79 (BP Location: Left Arm)   Pulse 66   Temp 98 F (36.7 C) (Oral)   Resp 17   Ht 5\' 2"  (1.575 m)   Wt 60.3 kg   SpO2 100%   BMI 24.33 kg/m  Physical Exam Physical Exam  Constitutional: Pt is oriented to person, place, and time. Pt appears well-developed and well-nourished. No distress.  HENT:  Head: Normocephalic and atraumatic.  Mouth/Throat: Oropharynx is clear and moist.  Eyes: Conjunctivae and EOM are normal. Pupils are equal, round, and reactive to light. No scleral icterus.  No horizontal, vertical or rotational nystagmus  Neck: Normal range of motion. Neck supple.  Full active and passive ROM without pain No midline or  paraspinal tenderness No nuchal rigidity or meningeal signs  Cardiovascular: Normal rate, regular rhythm and intact distal pulses.   Pulmonary/Chest: Effort normal and breath sounds normal. No respiratory distress. Pt has no wheezes. No rales.  Abdominal: Soft. Bowel sounds are normal. There is no tenderness. There is no rebound and no guarding.  Musculoskeletal: Normal range of motion.  Lymphadenopathy:    No cervical adenopathy.  Neurological: Pt. is alert and oriented to person, place, and time. He has normal reflexes. No cranial nerve deficit.  Exhibits normal muscle tone. Coordination normal.  Mental Status:  Alert, oriented, thought content appropriate. Speech fluent without evidence of aphasia. Able to follow 2 step commands without difficulty.  Cranial Nerves:  II:  Peripheral visual fields grossly normal, pupils equal, round, reactive to light III,IV, VI: ptosis not present, extra-ocular motions intact bilaterally  V,VII: smile symmetric, facial light touch sensation equal VIII: hearing grossly normal bilaterally  IX,X: midline uvula rise  XI: bilateral shoulder shrug equal and strong XII: midline tongue extension  Motor:  5/5 in upper and lower extremities bilaterally including strong and equal grip strength and dorsiflexion/plantar flexion Sensory: Pinprick and light touch normal in all extremities.  Deep Tendon Reflexes: 2+ and symmetric  Cerebellar: normal finger-to-nose with bilateral upper extremities Gait: normal gait and balance CV: distal pulses palpable throughout   Skin: Skin is warm and dry. No rash noted. Pt is not diaphoretic.  Psychiatric: Pt has a normal mood and affect. Behavior is normal. Judgment and thought content normal.  Nursing note and vitals reviewed.  ED Results / Procedures / Treatments   Labs (all labs ordered are listed, but only abnormal results are displayed) Labs Reviewed  CBC  COMPREHENSIVE METABOLIC PANEL    EKG None  Radiology No  results found.  Procedures .Critical Care  Performed by: Arthor Captain, PA-C Authorized by: Arthor Captain, PA-C   Critical care provider statement:    Critical care time (minutes):  30   Critical care time was exclusive of:  Separately billable procedures and treating other patients   Critical care was necessary to treat or prevent imminent or life-threatening deterioration of the following conditions:  CNS failure or compromise   Critical care was time spent personally by me on the following activities:  Development of treatment plan with patient or surrogate, discussions with consultants, evaluation of patient's response to treatment, examination of patient, ordering and review of laboratory studies, ordering and review of radiographic studies, ordering and performing treatments and interventions, pulse oximetry, re-evaluation of patient's condition and review of old charts     Medications Ordered in ED Medications  metoCLOPramide (REGLAN) injection 5 mg (has no administration in time range)  diphenhydrAMINE (BENADRYL) injection 12.5 mg (has no administration in time range)    ED Course/  Medical Decision Making/ A&P Clinical Course as of 01/20/23 2321  Thu Jan 18, 2023  1650 Chloride(!): 96 [AH]  1650 BUN(!): 26 [AH]  1650 Total Protein(!): 6.3 [AH]  1650 CBC [AH]  2157 No triptans [AH]    Clinical Course User Index [AH] Arthor Captain, PA-C                             Medical Decision Making This patient presents to the ED for concern of abnormal neuro sxs, this involves an extensive number of treatment options, and is a complaint that carries with it a high risk of complications and morbidity.  Emergent considerations for headache include subarachnoid hemorrhage, meningitis, temporal arteritis, glaucoma, cerebral ischemia, carotid/vertebral dissection, intracranial tumor, Venous sinus thrombosis, carbon monoxide poisoning, acute or chronic subdural hemorrhage.  Other  considerations include: Migraine, Cluster headache, Hypertension, Caffeine, alcohol, or drug withdrawal, Pseudotumor cerebri, Arteriovenous malformation, Head injury, Neurocysticercosis, Post-lumbar puncture, Preeclampsia, Tension headache, Sinusitis, Cervical arthritis, Refractive error causing strain, Dental abscess, Otitis media, Temporomandibular joint syndrome, Depression, Somatoform disorder (eg, somatization) Trigeminal neuralgia, Glossopharyngeal neuralgia.   Co morbidities:      History of atypical migraines  Social Determinants of Health:       good family support  Additional history:  {Additional history obtained from daughter at bedside   Lab Tests:  I Ordered, and personally interpreted labs.  The pertinent results include: CBC without abnormality, CMP essentially unremarkable  Imaging Studies:  I ordered imaging studies including CT head and MRI with and without contrast I independently visualized and interpreted imaging which showed small volume subarachnoid hemorrhage I agree with the radiologist interpretation  Cardiac Monitoring/ECG:       The patient was maintained on a cardiac monitor.  I personally viewed and interpreted the cardiac monitored which showed an underlying rhythm of: Normal sinus rhythm  Medicines ordered and prescription drug management:  I ordered medication including Medications metoCLOPramide (REGLAN) injection 5 mg (5 mg Intravenous Given 01/18/23 1603)  diphenhydrAMINE (BENADRYL) injection 12.5 mg (12.5 mg Intravenous Given 01/18/23 1604) diazepam (VALIUM) injection 2.5 mg (2.5 mg Intravenous Given 01/18/23 2014) gadobutrol (GADAVIST) 1 MMOL/ML injection 6 mL (6 mLs Intravenous Contrast Given 01/18/23 2058) for abnormal neurosymptoms Reevaluation of the patient after these medicines showed that the patient resolved I have reviewed the patients home medicines and have made adjustments as needed  Test Considered:         Critical  Interventions:         Consultations Obtained: Case discussed with Dr. Conchita Paris.  He states that there is nothing to do.  She does not need follow-up, serial imaging or admission.  She has controlled blood pressure.  He states that she needs to follow closely with her neurologist.  Problem List / ED Course:       (G43.109) Complicated migraine  (primary encounter diagnosis)  (I60.9) SAH (subarachnoid hemorrhage)   MDM: Patient here with abnormal neurosymptoms.  After review of all data points patient appears to have acute punctate subarachnoid hemorrhage.  I am unsure if this is actually the cause of her symptoms this evening or a coincidental finding.  Is been having the symptoms for years.  I also discussed the case with Dr. Derry Lory who states that she should not take sumatriptan.  She does have this at home.  She understands that she should not take any triptan medications and I will prescribe Reglan which took care of her symptoms  this evening.  I have given her an urgent follow-up appointment with neurology.  She has no symptoms after treatment for migraine.  I answered all questions to the best of my ability.  She presented was appropriate for discharge with strict return precautions   Dispostion:  After consideration of the diagnostic results and the patients response to treatment, I feel that the patent would benefit from discharge with close outpatient follow-up.    Amount and/or Complexity of Data Reviewed Labs: ordered. Decision-making details documented in ED Course. Radiology: ordered.  Risk Prescription drug management.           Final Clinical Impression(s) / ED Diagnoses Final diagnoses:  Complicated migraine  SAH (subarachnoid hemorrhage)    Rx / DC Orders ED Discharge Orders     None         Arthor Captain, PA-C 01/20/23 2327    Wynetta Fines, MD 01/22/23 [phone]

## 2023-01-18 NOTE — Discharge Instructions (Addendum)
As Discussed, DO NOT take any  Tripan medications for migraines.  Get help right away if: Your migraine headache becomes severe or lasts more than 72 hours. You have a fever or stiff neck. You have vision loss. Your muscles feel weak or like you cannot control them. You lose your balance often or have trouble walking. You faint. You have a seizure. Contact a health care provider if: You have a stiff neck. You have a cough. You have a fever. Get help right away if: You have any symptoms of a stroke. "BE FAST" is an easy way to remember the main warning signs of a stroke: B - Balance. Signs are dizziness, sudden trouble walking, or loss of balance. E - Eyes. Signs are trouble seeing or a sudden change in vision. F - Face. Signs are sudden weakness or numbness of the face, or the face or eyelid drooping on one side. A - Arms. Signs are weakness or numbness in an arm. This happens suddenly and usually on one side of the body. S - Speech. Signs are sudden trouble speaking, slurred speech, or trouble understanding what people say. T - Time. Time to call emergency services. Write down what time symptoms started. You have other signs of a stroke, such as: A sudden, severe headache with no known cause. Nausea or vomiting. Seizure. These symptoms may be an emergency. Get help right away. Call 911. Do not wait to see if the symptoms will go away. Do not drive yourself to the hospital.

## 2023-01-31 ENCOUNTER — Telehealth: Payer: Self-pay | Admitting: Neurology

## 2023-01-31 ENCOUNTER — Ambulatory Visit: Payer: Federal, State, Local not specified - PPO | Admitting: Neurology

## 2023-01-31 ENCOUNTER — Encounter: Payer: Self-pay | Admitting: Neurology

## 2023-01-31 VITALS — BP 120/81 | HR 74 | Ht 62.0 in | Wt 130.0 lb

## 2023-01-31 DIAGNOSIS — I609 Nontraumatic subarachnoid hemorrhage, unspecified: Secondary | ICD-10-CM

## 2023-01-31 DIAGNOSIS — I773 Arterial fibromuscular dysplasia: Secondary | ICD-10-CM | POA: Diagnosis not present

## 2023-01-31 NOTE — Telephone Encounter (Signed)
[employer] federal NPR sent to GI [phone] 

## 2023-01-31 NOTE — Progress Notes (Signed)
[facility]    Provider:  Dr Lucia Gaskins Requesting Provider: Arthor Captain, PA-C Primary Care Provider:  Geoffry Paradise, MD  CC:  subarachnoid hemorrhage  HPI:  Sierra PROPHETE is a 82 y.o. female here as requested by Arthor Captain, PA-C for [facility]. 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  She also has B12 deficiency, in the past diagnosed and treated for migraines by Dr. Anne Hahn last seen 2022, past CTAs showed fibromuscular dysplasia, and recent MRIs showed acute subarachnoid hemorrhage but a history of extensive bilateral subarachnoid hemorrhages unclear if due to fibromuscular dysplasia I have reached out to Dr. Pearlean Brownie and Dr. Roda Shutters for direction.   This is a patient who had seen Dr. Anne Hahn in our group in the past for migraines.  She was referred from the emergency room visit January 18, 2023.  I reviewed emergency room notes from Dr. Rodena Medin January 18, 2023 where she presented with atypical migraine equivalent, her preventative medication at that time was 100 mg of propranolol and no abortive medications, the patient typically has 1-2 events a month but presented to the ED with 3 days of multiple events.  Her usual migraines are paresthesias and numbness in the fingers and hands face neck and tongue only on the right side but that day she had bilateral symptoms without headache vision change or neck stiffness and she appeared to have right sided facial weakness and thick tongue with speech.  She had this in the past.  Physical exam including neurologic exam was normal.  They gave her a migraine cocktail of Reglan and Benadryl.  Patient had an SAH  Triptans contraindicated.  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 Sulcal FLAIR hyperintensity and SWI signal dropout in the right central sulcus most likely reflects acute subarachnoid hemorrhage, though meningitis can have a similar appearance. Recommend noncontrast head CT to evaluate for acute blood. 2. Fairly extensive additional SWI signal dropout overlying the cortex of both cerebral hemispheres consistent with sequela of prior subarachnoid hemorrhage.   Patient reports: This is a patient who has a history of migraine with aura, she presented to the emergency room, with similar symptoms, numbness tingling paresthesias in the fingers and hands face neck and tongue usually only on the right but has had a bilaterally this has happened before she went to the emergency room April 11 for the symptoms and was referred furred here for complicated migraines. No falls. No hitting of the head. No numbness, no tingling, no weakness currently. She is going to stop ASA.    Reviewed notes, labs and imaging from outside physicians, which showed:  01/18/2023: CT head:  Narrative & Impression  CLINICAL DATA:  Subarachnoid hemorrhage seen on MRI.   EXAM: CT HEAD WITHOUT CONTRAST   TECHNIQUE: Contiguous axial images were obtained from the base of the skull through the vertex without intravenous contrast.   RADIATION DOSE REDUCTION: This exam was performed according to the departmental dose-optimization program which includes automated exposure control, adjustment of the mA and/or kV according to patient size and/or use of iterative reconstruction technique.   COMPARISON:  Same day MRI   FINDINGS: Brain: There is small volume acute subarachnoid  hemorrhage in the right central sulcus as seen on same day MRI. There is no parenchymal edema.   There is no other acute intracranial hemorrhage or extra-axial fluid collection. There is no evidence of acute territorial  infarct.   Parenchymal volume is stable. The ventricles are normal in size. The pituitary and suprasellar region are normal. There is no mass lesion. There is no mass effect or midline shift.   Vascular: No hyperdense vessel or unexpected calcification.   Skull: Normal. Negative for fracture or focal lesion.   Sinuses/Orbits: The imaged paranasal sinuses are clear. Bilateral lens implants are in place. The globes and orbits are otherwise unremarkable.   Other: None.   IMPRESSION: Small volume acute subarachnoid hemorrhage in the right central sulcus.     CLINICAL DATA:  Numbness in bilateral hands and face since 6 a.m. History of complicated migraines.   EXAM: MRI HEAD WITHOUT AND WITH CONTRAST   TECHNIQUE: Multiplanar, multiecho pulse sequences of the brain and surrounding structures were obtained without and with intravenous contrast.   CONTRAST:  6mL GADAVIST GADOBUTROL 1 MMOL/ML IV SOLN   COMPARISON:  Brain MRI 09/01/2015, CTA head/neck 10/28/2015   FINDINGS: Brain: There is sulcal FLAIR hyperintensity overlying the right central sulcus with associated SWI signal dropout and DWI signal abnormality most in keeping with subarachnoid hemorrhage. Meningitis would be a differential consideration.   There is fairly extensive additional curvilinear SWI signal dropout overlying the cortex of both cerebral hemispheres in keeping with sequela of prior subarachnoid hemorrhage. There is no definite evidence of other acute intracranial hemorrhage or extra-axial fluid collection. There is no evidence of acute infarct   Background parenchymal volume is normal. The ventricles are normal in size. Scattered foci of FLAIR signal abnormality in the supratentorial white matter are nonspecific but likely reflect sequela of chronic small-vessel ischemic change, mild for age.   The pituitary and suprasellar region are normal. There is no mass lesion or abnormal enhancement. There is  no mass effect or midline shift.   Vascular: Normal flow voids.   Skull and upper cervical spine: Normal marrow signal.   Sinuses/Orbits: The paranasal sinuses are clear. Bilateral lens implants are in place. The globes and orbits are otherwise unremarkable.   Other: None.   IMPRESSION: MRI brain 01/2022 1. Sulcal FLAIR hyperintensity and SWI signal dropout in the right central sulcus most likely reflects acute subarachnoid hemorrhage, though meningitis can have a similar appearance. Recommend noncontrast head CT to evaluate for acute blood. 2. Fairly extensive additional SWI signal dropout overlying the cortex of both cerebral hemispheres consistent with sequela of prior subarachnoid hemorrhage.  CTA head and neck 2017:  IMPRESSION: 1. No atherosclerosis in the neck. Minimal ICA siphon atherosclerosis. Mild distal aortic arch calcified atherosclerosis. 2. No intracranial or extracranial stenosis. 3. 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 4. 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 5. Stable and normal CT appearance of the brain.   Review of Systems: Patient complains of symptoms per HPI as well as the following symptoms SAH. Pertinent negatives and positives per HPI. All others negative.   Social History   Socioeconomic History   Marital status: Divorced    Spouse name: Not on file   Number of children: 2   Years of education: 13   Highest education level: Not on file  Occupational History   Occupation: Technical brewer    Comment: part time-8 hours a week  Tobacco Use  Smoking status: Never    Passive exposure: Never   Smokeless tobacco: Never  Vaping Use   Vaping Use: Never used  Substance and Sexual Activity   Alcohol use: No   Drug use: No   Sexual activity: Yes  Other Topics Concern   Not on file  Social History Narrative   Lives at home w/ her daughter and grandson   Patient  drinks about 2 cups of caffeine daily.   Patient is left handed.    Social Determinants of Health   Financial Resource Strain: Not on file  Food Insecurity: Not on file  Transportation Needs: Not on file  Physical Activity: Not on file  Stress: Not on file  Social Connections: Not on file  Intimate Partner Violence: Not on file    Family History  Problem Relation Age of Onset   Thyroid disease Mother    Dementia Father    Migraines Father    Colon cancer Neg Hx    Esophageal cancer Neg Hx    Stomach cancer Neg Hx    Rectal cancer Neg Hx     Past Medical History:  Diagnosis Date   Cataract    Chronic kidney disease    deformed left kidney- removed at age 72   Common migraine 10/21/2015   Hyperlipidemia    Hypertension    Macular degeneration    Eyelea injections every 16 weeks   Osteoporosis    take Reclast yearly   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 08/01/2022   PONV (postoperative nausea and vomiting)    extreme has put her in the hospital   Vitamin B12 deficiency 04/27/2021    Patient Active Problem List   Diagnosis Date Noted   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 08/01/2022   Vitamin B12 deficiency 04/27/2021   Common migraine 10/21/2015   Migraine equivalent    TIA (transient ischemic attack) 09/01/2015   HTN (hypertension) 09/01/2015   Hypokalemia 09/01/2015   Macular degeneration 09/01/2015   Hyperlipidemia 09/01/2015    Past Surgical History:  Procedure Laterality Date   BREAST LUMPECTOMY WITH RADIOACTIVE SEED LOCALIZATION Right 09/21/2020   Procedure: RIGHT BREAST LUMPECTOMY WITH RADIOACTIVE SEED LOCALIZATION;  Surgeon: Manus Rudd, MD;  Location: [facility];  Service: General;  Laterality: Right;   BREAST LUMPECTOMY WITH RADIOACTIVE SEED LOCALIZATION Right 07/13/2022   Procedure: RIGHT BREAST LUMPECTOMY WITH RADIOACTIVE SEED LOCALIZATION;  Surgeon: Manus Rudd, MD;  Location:  [facility] OR;  Service: General;  Laterality: Right;   CATARACT EXTRACTION, BILATERAL Bilateral L 03-27-16  R 04-24-16   COLONOSCOPY     screenin gonly benign polyps removed   NEPHRECTOMY  1963   left   TUBAL LIGATION  1975    Current Outpatient Medications  Medication Sig Dispense Refill   acetaminophen (TYLENOL) 500 MG tablet Take 500 mg by mouth every 6 (six) hours as needed for mild pain or headache.     Calcium Carb-Cholecalciferol (CALCIUM + D3) 600-200 MG-UNIT TABS Take 1 tablet by mouth 2 (two) times daily.     Coenzyme Q10 (CO Q 10 PO) Take 200 mg by mouth daily.     docusate sodium (COLACE) 100 MG capsule Take 100 mg by mouth daily.     levothyroxine (SYNTHROID) 50 MCG tablet Take 50 mcg by mouth every morning.     Lysine 500 MG TABS Take 500 mg by mouth daily.     metoCLOPramide (REGLAN) 10 MG tablet Take 0.5-1 tablets (  5-10 mg total) by mouth every 6 (six) hours as needed for nausea (nausea/headache). 10 tablet 0   Omega-3 Fatty Acids (FISH OIL) 1000 MG CAPS Take 1,000 mg by mouth daily.     polyvinyl alcohol (LIQUIFILM TEARS) 1.4 % ophthalmic solution Place 1 drop into both eyes 3 (three) times daily. [employer] brand     propranolol ER (INDERAL LA) 120 MG 24 hr capsule TAKE 1 CAPSULE(120 MG) BY MOUTH DAILY 90 capsule 2   triamterene-hydrochlorothiazide (MAXZIDE-25) 37.5-25 MG tablet Take 1 tablet by mouth at bedtime.     TURMERIC PO Take 1 capsule by mouth daily.     vitamin B-12 (CYANOCOBALAMIN) 1000 MCG tablet Take 1,000 mcg by mouth daily.     No current facility-administered medications for this visit.    Allergies as of 01/31/2023   (No Known Allergies)    Vitals: BP 120/81   Pulse 74   Ht 5\' 2"  (1.575 m)   Wt 130 lb (59 kg)   BMI 23.78 kg/m  Last Weight:  Wt Readings from Last 1 Encounters:  01/31/23 130 lb (59 kg)   Last Height:   Ht Readings from Last 1 Encounters:  01/31/23 5\' 2"  (1.575 m)     Physical exam: Exam: Gen: NAD, conversant, well nourised,  thin, well groomed                     CV: RRR, no MRG. No Carotid Bruits. No peripheral edema, warm, nontender Eyes: Conjunctivae clear without exudates or hemorrhage  Neuro: Detailed Neurologic Exam  Speech:    Speech is normal; fluent and spontaneous with normal comprehension.  Cognition:    The patient is oriented to person, place, and time;     recent and remote memory intact;     language fluent;     normal attention, concentration,     fund of knowledge Cranial Nerves:    The pupils are equal, round, and reactive to light.  Attempted to visualize fundi however pupils too small.  Visual fields are full to finger confrontation. Extraocular movements are intact. Trigeminal sensation is intact and the muscles of mastication are normal. The face is symmetric. The palate elevates in the midline. Hearing intact. Voice is normal. Shoulder shrug is normal. The tongue has normal motion without fasciculations.   Coordination:    Normal finger to nose and heel to shin. Normal rapid alternating movements.   Gait: Normal Motor Observation: Normal tone:    Normal muscle tone.    Posture:    Posture is normal. normal erect    Strength:    Strength is V/V in the upper and lower limbs.      Sensation: intact to LT     Reflex Exam:  DTR's:    Deep tendon reflexes in the upper and lower extremities are symmetrical bilaterally.   Toes:    The toes are equivocal bilaterally.   Clonus:    Clonus is absent.    Assessment/Plan:  82 y.o. female here as requested by Arthor Captain, PA-C for [facility]. 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  She also has B12 deficiency, in the past diagnosed and treated for migraines by Dr. Anne Hahn last seen 2022, past CTAs showed fibromuscular  dysplasia, and recent MRIs showed acute subarachnoid hemorrhage but a history of extensive bilateral subarachnoid hemorrhages unclear  etiology: I have reached out to Dr. Pearlean Brownie and Dr. Roda Shutters for direction.  I am going to order a CTA of the head and neck to ensure that there are no aneurysms, I set her up with a second opinion with Dr. Pearlean Brownie who is our vascular doctor  - headache with sulcal subarachnoid hemorrhage.  Prior subarachnoid hemorrhage.  These were not seen an MRI of the brain in 2016.  Unclear etiology, she denies any head trauma or any falls, after review of the images I do not see very many microhemorrhages which could be for example amyloid angiopathy which puts the patient at risk for subarachnoid hemorrhages.  And CTA in the past showed possible fibromuscular dysplasia unclear if this could be a cause.  I ordered a CTA of the head and neck.  I have also asked her to stop any antiplatelets such as aspirin.  Also stop Imitrex, and stop Lipitor (sent her a MyChart to make sure that I also mention the Lipitor).  I did inform her if she has another headache or anything unusual to go directly to the emergency room, preferably call 911.  MRI in 2017 showed fibromuscular dysplasia I am not sure if this may have something to do with her subarachnoid hemorrhages or she may need a CT angiography.  Will wait for results of CTA of the head   MRI brain (these were not seen on MRI in 2016): 1. Sulcal FLAIR hyperintensity and SWI signal dropout in the right central sulcus most likely reflects acute subarachnoid hemorrhage, though meningitis can have a similar appearance. Recommend noncontrast head CT to evaluate for acute blood. 2. Fairly extensive additional SWI signal dropout overlying the cortex of both cerebral hemispheres consistent with sequela of prior subarachnoid hemorrhage.  Sent mychart message to patient: Candee Hoon, I just wanted to let you know I did order the imaging of your blood vessels and  hopefully will get those done as soon as possible.  I have informed my team that as soon as Dr. Pearlean Brownie gets a cancellation we will offer it to you, because he is our vascular doctor and had a ball of stroke and I think he needs to see you for these hemorrhages that you have been having for unknown reasons.  In addition I know we talked about stopping aspirin and Imitrex but it also like you to stop the Lipitor, or atorvastatin, and subarachnoid hemorrhages or any hemorrhages at all we always also stop the statin and I am not sure if I discussed that with you.  So please not only stop your aspirin, do not take your Imitrex but also stop your atorvastatin and we will see if we can get you in to see Dr. Pearlean Brownie or vascular doctor soon as possible and see what happens when we take a look at the blood vessels in your head.  Of course for any other headaches please call 911 and go directly to the emergency room.  Thanks Dr. Daisy Blossom  Orders Placed This Encounter  Procedures   CT ANGIO HEAD W OR WO CONTRAST   CT ANGIO NECK W OR WO CONTRAST   No orders of the defined types were placed in this encounter.   Cc: Delos Haring,  Geoffry Paradise, MD  Naomie Dean, MD  [facility] [address] [place], [place] [address]  Phone [phone] Fax [phone]  I spent 80 minutes of face-to-face and non-face-to-face time with patient on the  1. SAH (subarachnoid hemorrhage) (HCC)  2. 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  This included previsit chart review, lab review, study review, order entry, electronic health record documentation, patient education on the different diagnostic and therapeutic options, counseling and coordination of care, risks and benefits of management, compliance, or risk factor reduction

## 2023-01-31 NOTE — Patient Instructions (Addendum)
Stop aspirin CT arteries of the head and neck to look for aneurysms  Watch blood pressure   From your 2017 CT of the arteries it showed fibromuscular dysplasia: need to examine this more as this can cause aneurysms and bleeds  IMPRESSION: 1. No atherosclerosis in the neck. Minimal ICA siphon atherosclerosis. Mild distal aortic arch calcified atherosclerosis. 2. No intracranial or extracranial stenosis. 3. 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 4. 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 5. Stable and normal CT appearance of the brain.   Fibromuscular dysplasia (FMD) affects the artery walls, making them either too weak or too stiff. This can lead to serious complications, including arterial narrowing (stenosis), weakening/bulging (aneurysm) or tearing (dissection). At least 90 percent of adults with FMD are women. People with FMD need to watch for serious symptoms, get regular checkups and refrain from smoking. FMD may be related to other connective tissue disorders, such as Marfan, Loeys-Dietz, or Ehlers-Danlos syndromes. What is fibromuscular dysplasia? Fibromuscular dysplasia (FMD) is a rare blood vessel disorder in which some of the strong, flexible cells of arteries are replaced with cells that are more fibrous. Fibrous cells are less strong and also less flexible. This change in composition of the arteries leads to their becoming stiffer and more prone to damage. FMD can lead to high blood pressure, stenosis, aneurysm and sometimes dissection of arteries.  FMD is different from most other vascular diseases because it does not involve inflammation or plaque. Many vascular problems are caused by atherosclerosis, a buildup of fatty plaque inside the arteries that hardens and narrows them, reducing blood flow and sometimes leading to aneurysm or dissection. FMD, on the other hand, is a disease of the artery walls  that can exist even when there is no plaque buildup.  How are arteries constructed? Arteries are made of three main layers:  Tunica intima means "inner coat" or inner layer, which comes in direct contact with the blood as it flows. Tunica media means "middle coat" and is the thickest layer. It is made of smooth muscle cells and elastic fibers, allowing the artery to stretch without breaking as blood pulses through. Tunica externa (sometimes called adventitia) means "outer coat" and surrounds the artery with elastic fibers as well as collagen, a gluey fiber that allows arteries to stick in place. What are the effects of fibromuscular dysplasia? A healthy, elastic artery responds to the rhythmic movement of blood by expanding and contracting as blood pulses through it. An artery affected by FMD may be too stiff -- or not stiff enough. If overly stiff, the artery will be unable to expand as blood rushes through it, leading to high blood pressure. If not stiff enough, the artery can balloon or dilate, leading to an aneurysm. In one common form of FMD, the tunica media alternates between these two states, causing the affected area to look like a string of beads: wide, then narrow, then wide again, then narrow again, etc.  Where does fibromuscular dysplasia occur? While FMD can be found in any location of the body, the most common areas are the renal arteries (leading to the kidneys) and the carotid and vertebral arteries in the neck that lead to the brain. Much less commonly, the mesenteric (digestive system) arteries may be involved.  FMD often manifests in more than one place.  What causes fibromuscular dysplasia? Not much is known about the causes of FMD.  FMD may be found in children.  A portion of cases are thought to be genetic. FMD may be related to hormones, as a high percentage of people diagnosed with FMD are women of childbearing age. Research is ongoing at [facility] and elsewhere to  help understand the causes of FMD. What are the symptoms of fibromuscular dysplasia? FMD may cause no symptoms at all. When symptoms do occur, they will vary depending on the type and location of fibromuscular dysplasia. Symptoms include:  Headache or migraine Pulsatile tinnitus, a ringing or swishing sound in the ears that occurs with the heartbeat Neck pain in the case of carotid artery dissection The presence of certain conditions may also suggest FMD, especially when the usual causes for these conditions, such as atherosclerosis, inflammation and older age, are absent. Conditions include:  Hypertension (high blood pressure), especially resistant hypertension, in a person younger than 35 Sudden-onset hypertension or a sharp rise in blood pressure when it was previously well controlled A bruit (whooshing sound over an artery) in the neck or abdomen Stroke or transient ischemic attack (TIA), especially in someone young and/or without atherosclerosis Spontaneous coronary artery dissection (SCAD), especially in a woman who has just given birth How is fibromuscular dysplasia diagnosed? Because FMD can have no symptoms, many people learn they have the condition as the result of an angiogram or other medical test done for another reason. For some, the condition may not even be diagnosed until they suffer an aneurysm, dissection or other critical event.  To confirm a diagnosis of fibromuscular dysplasia, your doctor may order some combination of the following:  Computed tomography angiography (CTA) Duplex ultrasound Magnetic resonance angiography (MRA) Digital subtraction angiography (DSA)

## 2023-02-03 ENCOUNTER — Encounter: Payer: Self-pay | Admitting: Neurology

## 2023-02-03 ENCOUNTER — Telehealth: Payer: Self-pay | Admitting: Neurology

## 2023-02-03 NOTE — Telephone Encounter (Signed)
This is a patient who needs a second opinion with Dr. Pearlean Brownie, multiple subarachnoid hemorrhages of unknown etiology.  His next appointment was in July but would you put her on the waiting list and also if you see a cancellation on Dr.Sethi schedule please call and offer to her first, I have already consulted Dr. Roda Shutters and Dr, Pearlean Brownie and its agree that she needs a second opinion with Dr. Pearlean Brownie.

## 2023-02-05 NOTE — Telephone Encounter (Signed)
Pt called back and accepted this appt. RN to open slot.

## 2023-02-05 NOTE — Telephone Encounter (Signed)
Called pt and LVM asking her to call the office back.

## 2023-02-07 ENCOUNTER — Encounter: Payer: Self-pay | Admitting: Neurology

## 2023-02-07 ENCOUNTER — Ambulatory Visit: Payer: Federal, State, Local not specified - PPO | Admitting: Neurology

## 2023-02-07 VITALS — BP 143/86 | HR 62 | Ht 62.0 in | Wt 130.0 lb

## 2023-02-07 DIAGNOSIS — Z8669 Personal history of other diseases of the nervous system and sense organs: Secondary | ICD-10-CM | POA: Diagnosis not present

## 2023-02-07 DIAGNOSIS — G43009 Migraine without aura, not intractable, without status migrainosus: Secondary | ICD-10-CM

## 2023-02-07 DIAGNOSIS — I609 Nontraumatic subarachnoid hemorrhage, unspecified: Secondary | ICD-10-CM

## 2023-02-07 DIAGNOSIS — I680 Cerebral amyloid angiopathy: Secondary | ICD-10-CM | POA: Diagnosis not present

## 2023-02-07 DIAGNOSIS — R202 Paresthesia of skin: Secondary | ICD-10-CM

## 2023-02-07 MED ORDER — DIVALPROEX SODIUM ER 250 MG PO TB24
250.0000 mg | ORAL_TABLET | Freq: Every day | ORAL | 3 refills | Status: DC
Start: 2023-02-07 — End: 2023-04-25

## 2023-02-07 NOTE — Patient Instructions (Addendum)
I had a long discussion with the patient and her daughter regarding her recurrent transient focal episodes of bilateral hand and face paresthesias likely representing a neurological events in the setting of cerebral amyloid angiopathy given presence of bilateral cortical sulcal subarachnoid hemorrhages.  I recommend further evaluation for EEG CT angiogram of the brain and neck.  Trial off Depakote ER 250 mg daily for migraine seizure prophylaxis.  Maintain strict control of hypertension with blood pressure goal below 130/90 and avoid any blood thinners.  Return for follow-up in 3 months or call earlier if necessary

## 2023-02-07 NOTE — Progress Notes (Signed)
[facility] [address] [place]. [place] [address] [phone]       OFFICE CONSULT NOTE  [name] Date of Birth:  07-Sep-1941 Medical Record Number:  [id number]   Referring MD:  Dr Lucia Gaskins  Reason for Referral: Subarachnoid hemorrhage  HPI: Sierra [name] is a 82 year old pleasant Caucasian lady seen today for office consultation visit for subarachnoid hemorrhage and TIA episodes.  She is accompanied by her daughter.  History is obtained from them and review of electronic medical records.  I personally reviewed pertinent available imaging films in PACS.  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  Fibromuscular dysplasia of carotid arteries.  Patient presented to [facility] emergency room on 01/18/2023 recurrent transient episodes of hand and face paresthesias in both sides.  He describes this as starting in the hands and then spreading to involve the cheeks right side more than left.  Episode lasted around 15 minutes.  She had 3 separate transient episodes which quite steotypical..  During 1 of these episodes her daughter noticed that there was right-sided facial droop and her speech was off with the thick.  She did not complain of significant headache but was treated with a migraine cocktail of Reglan and Benadryl.  CT scan of the head and subsequently MRI scan showed right frontal convexity sulcal subarachnoid hemorrhage.  Brain and echo images showed moderate subarachnoid hemorrhage in the left convexity as well as posteriorly of the right parietal occipital region as well.  Patient states she had 1 more episode after she went home that day did fine for several weeks until yesterday when she had similar episode which lasted only 5 minutes.  There are no obvious triggers for these episodes.  There is no headache prior to during or  after these episodes.  These episodes seem quite distant from her previous migraines and complicated migraine episodes.-In the past.  Patient is scheduled to undergo CT angiogram of the brain and neck which has not yet been done.  Patient denies any history of significant head injury with loss of consciousness, she does not take any blood thinners given aspirin.  She denies any memory difficulties, cognitive impairment.  She is living with her daughter but is quite independent in all activities of daily living.  She does at times forget appointments and daughter needs to remind her.  She denies any current migraine and the last migraine episode was 6 months ago.  She was seen previously in the office by Dr. Anne Hahn for migraines.  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   PONV (postoperative nausea and vomiting)    extreme has put her in the hospital   Vitamin B12 deficiency 04/27/2021    Social History:  Social History   Socioeconomic History   Marital status: Divorced    Spouse name: Not on file   Number of children: 2  Years of education: 61   Highest education level: Not on file  Occupational History   Occupation: Technical brewer    Comment: part time-8 hours a week  Tobacco Use   Smoking status: Never    Passive exposure: Never   Smokeless tobacco: Never  Vaping Use   Vaping Use: Never used  Substance and Sexual Activity   Alcohol use: No   Drug use: No   Sexual activity: Yes  Other Topics Concern   Not on file  Social History  Narrative   Lives at home w/ her daughter and grandson   Patient drinks about 2 cups of caffeine daily.   Patient is left handed.    Social Determinants of Health   Financial Resource Strain: Not on file  Food Insecurity: Not on file  Transportation Needs: Not on file  Physical Activity: Not on file  Stress: Not on file  Social Connections: Not on file  Intimate Partner Violence: Not on file    Medications:   Current Outpatient Medications on File Prior to Visit  Medication Sig Dispense Refill   acetaminophen (TYLENOL) 500 MG tablet Take 500 mg by mouth every 6 (six) hours as needed for mild pain or headache.     Calcium Carb-Cholecalciferol (CALCIUM + D3) 600-200 MG-UNIT TABS Take 1 tablet by mouth 2 (two) times daily.     Coenzyme Q10 (CO Q 10 PO) Take 200 mg by mouth daily.     docusate sodium (COLACE) 100 MG capsule Take 100 mg by mouth daily.     levothyroxine (SYNTHROID) 50 MCG tablet Take 50 mcg by mouth every morning.     Lysine 500 MG TABS Take 500 mg by mouth daily.     metoCLOPramide (REGLAN) 10 MG tablet Take 0.5-1 tablets (5-10 mg total) by mouth every 6 (six) hours as needed for nausea (nausea/headache). 10 tablet 0   Omega-3 Fatty Acids (FISH OIL) 1000 MG CAPS Take 1,000 mg by mouth daily.     polyvinyl alcohol (LIQUIFILM TEARS) 1.4 % ophthalmic solution Place 1 drop into both eyes 3 (three) times daily. [employer] brand     propranolol ER (INDERAL LA) 120 MG 24 hr capsule TAKE 1 CAPSULE(120 MG) BY MOUTH DAILY 90 capsule 2   triamterene-hydrochlorothiazide (MAXZIDE-25) 37.5-25 MG tablet Take 1 tablet by mouth at bedtime.     TURMERIC PO Take 1 capsule by mouth daily.     vitamin B-12 (CYANOCOBALAMIN) 1000 MCG tablet Take 1,000 mcg by mouth daily.     No current facility-administered medications on file prior to visit.    Allergies:  No Known Allergies  Physical Exam General: well developed, well nourished pleasant elderly Caucasian lady, seated, in no evident  distress Head: head normocephalic and atraumatic.   Neck: supple with no carotid or supraclavicular bruits Cardiovascular: regular rate and rhythm, no murmurs Musculoskeletal: no deformity Skin:  no rash/petichiae Vascular:  Normal pulses all extremities  Neurologic Exam Mental Status: Awake and fully alert. Oriented to place and time. Recent and remote memory intact. Attention span, concentration and fund of knowledge appropriate. Mood and affect appropriate.  Mini-Mental status exam score 29/30.  Able to name 12 animals which can walk on 4 legs.  Clock drawing 4/4.Marland Kitchen  Depression scale 1 not depressed. Cranial Nerves: Fundoscopic exam reveals sharp disc margins. Pupils equal, briskly reactive to light. Extraocular movements full without nystagmus. Visual fields full to confrontation. Hearing slightly decreased t. Facial sensation intact. Face, tongue, palate moves normally and symmetrically.  Motor:  Normal bulk and tone. Normal strength in all tested extremity muscles. Sensory.: intact to touch , pinprick , position and vibratory sensation.  Coordination: Rapid alternating movements normal in all extremities. Finger-to-nose and heel-to-shin performed accurately bilaterally. Gait and Station: Arises from chair without difficulty. Stance is normal. Gait demonstrates normal stride length and balance . Able to heel, toe and tandem walk with slight difficulty.  Reflexes: 1+ and symmetric. Toes downgoing.   NIHSS  0 Modified Rankin  0     02/07/2023    4:33 PM  MMSE - Mini Mental State Exam  Orientation to time 5  Orientation to Place 5  Registration 3  Attention/ Calculation 4  Recall 3  Language- name 2 objects 2  Language- repeat 1  Language- follow 3 step command 3  Language- read & follow direction 1  Write a sentence 1  Copy design 1  Total score 29    ASSESSMENT: 82 year old Caucasian lady with recurrent transient focal transient neurological episodes ( FTNEs) likely amyloid  spells given abnormal CT and MRI showing bilateral sulcal subarachnoid hemorrhages.  Cortical vein thrombosis or posttraumatic hemorrhage is supportive history is lacking.  Remote history of common migraine with 1 episode of complicated migraines in the past.  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  I recommend further evaluation for EEG CT angiogram of the brain and neck.  Trial off Depakote ER 250 mg daily for migraine seizure prophylaxis.  Maintain strict control of hypertension with blood pressure goal below 130/90 and avoid any blood thinners.  Return for follow-up in 3 months or call earlier if necessary.  Greater than 50% time during this 45-minute consultation visit was spent in counseling and coordination of care about recurrent episodes of TIAs and discussion about CT and MRI results and answering questions.  Delia Heady, MD Note: This document was prepared with digital dictation and possible smart phrase technology. Any transcriptional errors that result from this process are unintentional.

## 2023-02-22 ENCOUNTER — Other Ambulatory Visit: Payer: Federal, State, Local not specified - PPO | Admitting: *Deleted

## 2023-03-07 ENCOUNTER — Other Ambulatory Visit: Payer: Self-pay | Admitting: Neurology

## 2023-03-07 ENCOUNTER — Ambulatory Visit
Admission: RE | Admit: 2023-03-07 | Discharge: 2023-03-07 | Disposition: A | Discharge status: Home / Self Care | Payer: Federal, State, Local not specified - PPO | Source: Ambulatory Visit | Attending: Neurology | Admitting: Neurology

## 2023-03-07 DIAGNOSIS — I609 Nontraumatic subarachnoid hemorrhage, unspecified: Secondary | ICD-10-CM

## 2023-03-07 DIAGNOSIS — I773 Arterial fibromuscular dysplasia: Secondary | ICD-10-CM

## 2023-03-07 MED ORDER — IOPAMIDOL (ISOVUE-370) INJECTION 76%
75.0000 mL | Freq: Once | INTRAVENOUS | Status: AC | PRN
  Administered 2023-03-07: 75 mL via INTRAVENOUS

## 2023-03-19 ENCOUNTER — Encounter: Payer: Self-pay | Admitting: *Deleted

## 2023-03-19 ENCOUNTER — Telehealth: Payer: Self-pay | Admitting: Neurology

## 2023-03-19 NOTE — Telephone Encounter (Signed)
Error

## 2023-03-19 NOTE — Telephone Encounter (Signed)
Patient arteries were wide open and looked good. No aneurt=ysms or vascukar deformities or cause of bleed.   Incidental finding was fibromuscular dysplasia. Fibromuscular dysplasia (FMD) is where some of the strong, flexible cells of arteries are replaced with cells that are more fibrous. Fibrous cells are less strong and also less flexible. This change in composition of the arteries leads to their becoming stiffer and more prone to damage. FMD can lead to high blood pressure, stenosis, aneurysm and sometimes dissection of arteries. It was seen in her carotids. Could also be in other parts of her body like renal arteries so she should follow up with primary care non urgently and I will forward to Dr. Jacky Kindle.   FMD is different from most other vascular diseases because it does not involve inflammation or plaque. Many vascular problems are caused by atherosclerosis, a buildup of fatty plaque inside the arteries that hardens and narrows them, reducing blood flow and sometimes leading to aneurysm or dissection. FMD, on the other hand, is a disease of the artery walls that can exist even when there is no plaque buildup.   We should place a referral to dr. Corliss Skains and see if he feels a cerebral angiogram to further evaluate is warranted. If she is willing, we can order and then let his PA know thanks

## 2023-03-19 NOTE — Telephone Encounter (Signed)
Spoke to patient gave test results Pt agreed to move forward with referral to dr. Corliss Skains  . Will forward to Dr Lucia Gaskins to make her aware pt wants to move forward with referral

## 2023-03-19 NOTE — Telephone Encounter (Signed)
-----   Message from Anson Fret, MD sent at 03/19/2023 12:28 PM EDT ----- See phone note and call patient

## 2023-03-19 NOTE — Telephone Encounter (Signed)
Error informed pt to ignore mychart message

## 2023-03-21 ENCOUNTER — Telehealth: Payer: Self-pay | Admitting: Neurology

## 2023-03-21 ENCOUNTER — Other Ambulatory Visit: Payer: Self-pay | Admitting: Neurology

## 2023-03-21 ENCOUNTER — Other Ambulatory Visit (HOSPITAL_COMMUNITY): Payer: Self-pay | Admitting: Interventional Radiology

## 2023-03-21 DIAGNOSIS — I773 Arterial fibromuscular dysplasia: Secondary | ICD-10-CM

## 2023-03-21 NOTE — Telephone Encounter (Signed)
Referral placed to Dr. Corliss Skains

## 2023-03-21 NOTE — Telephone Encounter (Signed)
Sent referral to Dr. Corliss Skains to evaluate and see if she needs a cerebral angiogram for fibromuscular dysplasia. thanks

## 2023-03-29 ENCOUNTER — Ambulatory Visit: Payer: Federal, State, Local not specified - PPO | Admitting: Neurology

## 2023-03-29 DIAGNOSIS — I609 Nontraumatic subarachnoid hemorrhage, unspecified: Secondary | ICD-10-CM

## 2023-03-29 DIAGNOSIS — R202 Paresthesia of skin: Secondary | ICD-10-CM

## 2023-04-02 ENCOUNTER — Ambulatory Visit (HOSPITAL_COMMUNITY): Payer: Federal, State, Local not specified - PPO

## 2023-04-06 NOTE — Progress Notes (Signed)
Kindly inform the patient that EEG or brainwave study was normal

## 2023-04-09 ENCOUNTER — Encounter: Payer: Self-pay | Admitting: Anesthesiology

## 2023-04-09 ENCOUNTER — Ambulatory Visit (HOSPITAL_COMMUNITY)
Admission: RE | Admit: 2023-04-09 | Discharge: 2023-04-09 | Disposition: A | Discharge status: Home / Self Care | Payer: Federal, State, Local not specified - PPO | Source: Ambulatory Visit | Attending: Interventional Radiology | Admitting: Interventional Radiology

## 2023-04-09 DIAGNOSIS — I773 Arterial fibromuscular dysplasia: Secondary | ICD-10-CM

## 2023-04-10 HISTORY — PX: IR RADIOLOGIST EVAL & MGMT: IMG5224

## 2023-04-11 ENCOUNTER — Ambulatory Visit: Payer: Federal, State, Local not specified - PPO | Admitting: Neurology

## 2023-04-11 ENCOUNTER — Other Ambulatory Visit (HOSPITAL_COMMUNITY): Payer: Self-pay | Admitting: Interventional Radiology

## 2023-04-11 DIAGNOSIS — I773 Arterial fibromuscular dysplasia: Secondary | ICD-10-CM

## 2023-04-16 ENCOUNTER — Ambulatory Visit: Payer: Federal, State, Local not specified - PPO | Admitting: Neurology

## 2023-04-23 ENCOUNTER — Other Ambulatory Visit: Payer: Self-pay | Admitting: Student

## 2023-04-23 DIAGNOSIS — Z01818 Encounter for other preprocedural examination: Secondary | ICD-10-CM

## 2023-04-23 NOTE — H&P (Incomplete)
Chief Complaint: Patient was seen in consultation today for subarachnoid hemorrhage at the request of Dr. Lucia Gaskins   Referring Physician(s): Dr. Lucia Gaskins   Supervising Physician: Julieanne Cotton  Patient Status: [facility] - Out-pt  History of Present Illness: Sierra [name] is a 82 y.o. female with PMHs of HTN, HLD, migraine, right breast CA, and subarachnoid hemorrhage who presents for diagnostic cerebral angiogram.   Patient presented to ED on 01/18/23 due to development of bilateral hand numbness, Mr brain showed possible SAH in the right central sulcus. CT head again showed small volume of acute SAH. Patient was discharged home from ED with urgent referral to neurology.   CTA head and neck on 03/18/23 showed no large vessel occlusion or aneurysm, patient was referred to Dr. Vivien Rota for further evaluation and she had consultation visit on 04/10/23. Per Dr. Corliss Skains, the [facility] was most likely caused by venous pathology such as a small cortical venous occlusion with resultant hemorrhage, versus rupture of a cortical vein, and an underlying arteriovenous fistulous communication such as a dural AV fistula cannot be excluded. Diagnostic cerebral angiogram for further evaluation was recommended to the patient, and after thorough discussion and shared decision making patient decided to proceed.   Patient presents to [facility] IR today for diagnostic cerebral angiogram with Dr. Corliss Skains.   Patient laying in bed, not in acute distress.  Denise headache, vision changes, fever, chills, shortness of breath, cough, chest pain, abdominal pain, nausea ,vomiting, and bleeding.  Reports chronic, dull sensation in the back of head sometimes in the morning while patient in supine position, it resolves as soon as she gets up. Does not have bilateral arm numbness anymore.  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 Never has problems with her right kidney since then.   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   PONV (postoperative nausea and vomiting)    extreme has put her in the hospital   Vitamin B12 deficiency 04/27/2021    Past Surgical History:  Procedure Laterality Date   BREAST LUMPECTOMY WITH RADIOACTIVE SEED LOCALIZATION Right 09/21/2020   Procedure: RIGHT BREAST LUMPECTOMY WITH RADIOACTIVE SEED LOCALIZATION;  Surgeon: Manus Rudd, MD;  Location: [facility];  Service: General;  Laterality: Right;   BREAST LUMPECTOMY WITH RADIOACTIVE SEED LOCALIZATION Right 07/13/2022   Procedure: RIGHT BREAST LUMPECTOMY WITH RADIOACTIVE SEED LOCALIZATION;  Surgeon: Manus Rudd, MD;  Location: [facility] OR;  Service: General;  Laterality: Right;   CATARACT EXTRACTION, BILATERAL Bilateral L 03-27-16  R 04-24-16   COLONOSCOPY     screenin gonly benign polyps removed   IR RADIOLOGIST EVAL & MGMT  04/10/2023   NEPHRECTOMY  1963   left   TUBAL LIGATION  1975    Allergies: Patient has no known allergies.  Medications: Prior to Admission medications   Medication Sig Start Date End Date Taking? Authorizing Provider  acetaminophen (TYLENOL) 500 MG tablet Take 500 mg by mouth every 6 (six) hours as needed for mild pain or headache.    [provider]  Calcium Carb-Cholecalciferol (CALCIUM + D3) 600-200 MG-UNIT TABS Take 1  tablet by mouth 2 (two) times daily.    [provider]  Coenzyme Q10 (CO Q 10 PO) Take 200 mg by mouth daily.    [provider]  divalproex (DEPAKOTE ER) 250 MG 24 hr tablet Take 1 tablet (250 mg total) by mouth daily. 02/07/23   Micki Riley, MD  docusate sodium (COLACE) 100 MG capsule Take 100 mg by mouth daily.     [provider]  levothyroxine (SYNTHROID) 50 MCG tablet Take 50 mcg by mouth every morning. 04/16/22   [provider]  Lysine 500 MG TABS Take 500 mg by mouth daily.    [provider]  metoCLOPramide (REGLAN) 10 MG tablet Take 0.5-1 tablets (5-10 mg total) by mouth every 6 (six) hours as needed for nausea (nausea/headache). 01/18/23   Arthor Captain, PA-C  Omega-3 Fatty Acids (FISH OIL) 1000 MG CAPS Take 1,000 mg by mouth daily.    [provider]  polyvinyl alcohol (LIQUIFILM TEARS) 1.4 % ophthalmic solution Place 1 drop into both eyes 3 (three) times daily. [employer] brand    [provider]  propranolol ER (INDERAL LA) 120 MG 24 hr capsule TAKE 1 CAPSULE(120 MG) BY MOUTH DAILY 12/20/20   York Spaniel, MD  triamterene-hydrochlorothiazide (MAXZIDE-25) 37.5-25 MG tablet Take 1 tablet by mouth at bedtime. 09/06/15   Marinda Elk, MD  TURMERIC PO Take 1 capsule by mouth daily.    [provider]  vitamin B-12 (CYANOCOBALAMIN) 1000 MCG tablet Take 1,000 mcg by mouth daily.    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   Patient is left handed.    Social Determinants of Health   Financial Resource Strain: Not on file  Food Insecurity: Not on file  Transportation Needs: Not on file  Physical Activity: Not on file  Stress: Not on file  Social Connections: Not on file     Review of Systems: A 12 point ROS discussed and pertinent positives are indicated in the HPI above.  All other systems are negative.  Vital Signs: BP 124/73   Pulse 66   Temp 98.1 F (36.7 C)   Resp 16   Ht 5\' 2"  (1.575 m)   Wt 130 lb (59 kg)   SpO2 99%   BMI 23.78 kg/m    Physical Exam Vitals and nursing note reviewed.  Constitutional:  General: Patient is not in acute distress.    Appearance: Normal appearance. Patient is not ill-appearing.  HENT:     Head: Normocephalic and atraumatic.     Mouth/Throat:     Mouth: Mucous membranes are moist.     Pharynx: Oropharynx is clear.  Cardiovascular:     Rate and Rhythm: Normal rate and regular rhythm.     Pulses: Normal pulses.     Heart sounds: Normal heart sounds.  Pulmonary:     Effort: Pulmonary effort is normal.     Breath sounds: Normal breath sounds.  Abdominal:     General: Abdomen is flat. Bowel sounds are normal.     Palpations: Abdomen is soft.  Musculoskeletal:     Cervical back: Neck supple.  Skin:    General: Skin is warm and dry.     Coloration: Skin is not jaundiced or pale.  Neurological:     Mental Status: Patient is alert and oriented to person, place, and time.  Psychiatric:        Mood and Affect: Mood normal.        Behavior: Behavior normal.        Judgment: Judgment normal.    MD Evaluation Airway: WNL Heart: WNL Abdomen: WNL Chest/ Lungs: WNL ASA  Classification: 3 Mallampati/Airway Score: Two  Imaging: IR Radiologist Eval & Mgmt  Result Date: 04/10/2023 EXAM: NEW PATIENT OFFICE VISIT CHIEF COMPLAINT: History of subarachnoid hemorrhage. Current Pain Level: 1-10 HISTORY OF PRESENT  ILLNESS: The patient is an 82 year old left handed lady who has been referred by Dr. Lucia Gaskins, her neurologist for evaluation of a recently discovered hemorrhage. History was obtained from the patient, and also from the patient's electronic medical records. The patient is initial symptoms started on January 18, 2023 at which time she experienced transient episodes of bilateral hand and facial paresthesias slightly worse on the right side. She has apparently had 3-4 these episodes almost identical and lasting less than 15 minutes. During one episode, patient appeared to have difficulty with speaking without altered mentation. No associated severe sudden headaches, nausea, vomiting, or photophobia or of loss of consciousness. Subsequent CT of the brain followed by an MRI of the brain revealed right convexity sulcal subarachnoid hemorrhage with extension into the right parietooccipital cortical region. 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 The patient denies any associated palpitations, chest heaviness or pain or pedal edema. She denies any wheezing, coughing or sputum production. The patient reports her appetite and weight to be intact. Denies any difficulty swallowing solids or liquids. No history of abdominal pains, constipation, diarrhea or melena. Denies recent dysuria frequency of micturition, or of hematuria. Diagnosis * : Date . * : Cataract * : . * : Chronic kidney disease * : * : deformed left kidney- removed at age 18 . * : Common migraine * : 10/21/2015 . * : Hyperlipidemia * : . * : Hypertension * : . * : Macular degeneration * : * : Eyelea injections every 16 weeks . * : Osteoporosis * : * : take Reclast yearly . 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 * : PONV (postoperative nausea and vomiting) * : * : extreme has put her in the hospital . * : Vitamin B12  deficiency * : 04/27/2021 Socioeconomic History . * : Marital status: * : Divorced * : * : Spouse name: * :  Not on file . * : Number of children: * : 2 . * : Years of education: * : 77 . * : Highest education level: * : Not on file Occupational History . * : Occupation: * : [employer] * : * : Comment: part time-8 hours a week Tobacco Use . * : Smoking status: * : Never * : * : Passive exposure: * : Never . * : Smokeless tobacco: * : Never Vaping Use . * : Vaping Use: * : Never used Substance and Sexual Activity . * : Alcohol use: * : No . * : Drug use: * : No . * : Sexual activity: * : Yes Other Topics * : Concern . * : Not on file Social History Narrative * : Lives at home w/ her daughter and grandson * : Patient drinks about 2 cups of caffeine daily. * : Patient is left handed. Food Insecurity: Not on file Transportation Needs: Not on file Physical Activity: Not on file Stress: Not on file Social Connections: Not on file Intimate Partner Violence: Not on file Medications: Current Outpatient Medications on File Prior to Visit Medication * : Sig * : Dispense * : Refill . * : acetaminophen (TYLENOL) 500 MG tablet * : Take 500 mg by mouth every 6 (six) hours as needed for mild pain or headache. * : * : . * : Calcium Carb-Cholecalciferol (CALCIUM + D3) 600-200 MG-UNIT TABS * : Take 1 tablet by mouth 2 (two) times daily. * : * : . * : Coenzyme Q10 (CO Q 10 PO) * : Take 200 mg by mouth daily. * : * : . * : docusate sodium (COLACE) 100 MG capsule * : Take 100 mg by mouth daily. * : * : . * : levothyroxine (SYNTHROID) 50 MCG tablet * : Take 50 mcg by mouth every morning. * : * : . * : Lysine 500 MG TABS * : Take 500 mg by mouth daily. * : * : . * : metoCLOPramide (REGLAN) 10 MG tablet * : Take 0.5-1 tablets (5-10 mg total) by mouth every 6 (six) hours as needed for nausea (nausea/headache). * : 10 tablet * : 0 . * : Omega-3 Fatty Acids (FISH OIL) 1000 MG CAPS * : Take 1,000 mg by mouth daily. * : * : . * : polyvinyl  alcohol (LIQUIFILM TEARS) 1.4 % ophthalmic solution * : Place 1 drop into both eyes 3 (three) times daily. [employer] brand * : * : . * : propranolol ER (INDERAL LA) 120 MG 24 hr capsule * : TAKE 1 CAPSULE(120 MG) BY MOUTH DAILY * : 90 capsule * : 2 . * : triamterene-hydrochlorothiazide (MAXZIDE-25) 37.5-25 MG tablet * : Take 1 tablet by mouth at bedtime. * : * : . * : TURMERIC PO * : Take 1 capsule by mouth daily. * : * : . * : vitamin B-12 (CYANOCOBALAMIN) 1000 MCG tablet * : Take 1,000 mcg by mouth daily. * : * : Allergies:  No Known Allergies. REVIEW OF SYSTEMS: Negative unless as mentioned above. PHYSICAL EXAMINATION: Alert,awake,oriented to time,place,space. Speech and comprehension intact. Normal eye contact. Appears to be in no acute distress. No gross lateralizing abnormal neurological features noted. Station and gait grossly normal. ASSESSMENT AND PLAN: The patient's recent neuroimaging studies including MRI scan, CT angiogram of the head and neck, and CT of the brain were reviewed with the patient. Upon reviewing the neuroimaging studies and the patient's clinical  history and examination, the subarachnoid hemorrhage in the right pre central sulcus region and the surrounding subarachnoid region as seen on the SWI images, was likely related to venous pathology such as a small cortical venous occlusion with resultant hemorrhage, versus rupture of a cortical vein. An underlying arteriovenous fistulous communication such as a dural AV fistula could not be entirely excluded on the recent studies. For more accurate analysis, a formal diagnostic catheter arteriogram would be needed. The procedure was reviewed in detail with the patient. This would be an outpatient procedure under conscious sedation. Patient will be provided with instructions prior to the procedure. The procedure would be either via a trans radial, or transfemoral route. Following the procedure the patient would be observed for 3-4 hours in  short-stay. Thereafter, the patient will be discharged home. A very small risk of an ischemic stroke, or vessel injury related to vascular access, and of renal function deterioration related to the contrast was reviewed. Questions were answered to the patient's satisfaction. The patient expressed her desire to proceed with a diagnostic catheter arteriogram as described. This will be scheduled as soon as possible. Electronically Signed   By: Julieanne Cotton M.D.   On: 04/10/2023 07:54   EEG adult        [facility] [address] [place]. [place] [address] [phone]      Electroencephalogram Procedure Note Sierra [name] Date of Birth:  09-05-1941 Medical Record Number:  [id number] Indications: Diagnostic Date of Procedure 6/25 2024 Medications: none Clinical history : 82 year old patient with history of TIA being evaluated for seizures Technical Description This study was performed using 17 channel digital electroencephalographic recording equipment. International 10-20 electrode placement was used. The record was obtained with the patient awake, drowsy, and asleep.  The record is of good technical quality for purposes of interpretation. Activation Procedures:  photic stimulation . EEG Description Awake: Alpha Activity: The waking state record contains a well-defined bi-occipital alpha rhythm of  moderate amplitude with a dominant frequency of 8 Hz. Reactivity is uncertain. No paroxsymal activity, spikes, or sharp waves are noted. Technical component of study is adequate. EKG tracing shows regular sinus rhythm Length of this recording is 26 minutes and 3 seconds Sleep: With drowsiness, there is attenuation of the background alpha activity. As the patient enters into light sleep, vertex waves and symmetrical spindles are noted. K complexes are noted in sleep. Transition to the waking state is unremarkable. Result of Activation Procedures: Hyperventilation: N/A. Photo Stimulation: No  photic driving response is noted. Summary Normal electroencephalogram, awake, asleep and with activation procedures. There are no focal lateralizing or epileptiform features.    Labs:  CBC: Recent Labs    07/07/22 1013 01/18/23 1558 04/24/23 0702  WBC 5.6 5.0 5.4  HGB 13.4 12.6 12.9  HCT 40.1 38.8 40.0  PLT 193 173 175    COAGS: Recent Labs    04/24/23 0702  INR 1.0    BMP: Recent Labs    07/07/22 1013 01/18/23 1558  NA 139 140  K 4.3 3.6  CL 106 96*  CO2 27 25  GLUCOSE 97 99  BUN 20 26*  CALCIUM 10.0 9.6  CREATININE 1.10* 0.99  GFRNONAA 50* 57*    LIVER FUNCTION TESTS: Recent Labs    01/18/23 1558  BILITOT 0.6  AST 23  ALT 16  ALKPHOS 47  PROT 6.3*  ALBUMIN 3.7    TUMOR MARKERS: No results for input(s): "AFPTM", "CEA", "CA199", "CHROMGRNA" in the last 8760  hours.  Assessment and Plan: 82 y.o. female with hx of complex migraine and recent finding of SAH who presents for diagnostic cerebral angiogram with Dr. Corliss Skains.  NPO since MN VSS CBC stable BMP  RF BUN 25, creatinine 1.11, GFR 50  INR 1.0 Not on AC/AP NLDA  Risks and benefits of cerebral angiogram with intervention were discussed with the patient including, but not limited to bleeding, infection, vascular injury, contrast induced renal failure, stroke or even death.  This interventional procedure involves the use of X-rays and because of the nature of the planned procedure, it is possible that we will have prolonged use of X-ray fluoroscopy.  Potential radiation risks to you include (but are not limited to) the following: - A slightly elevated risk for cancer  several years later in life. This risk is typically less than 0.5% percent. This risk is low in comparison to the normal incidence of human cancer, which is 33% for women and 50% for men according to the [employer] Society. - Radiation induced injury can include skin redness, resembling a rash, tissue breakdown / ulcers and  hair loss (which can be temporary or permanent).   The likelihood of either of these occurring depends on the difficulty of the procedure and whether you are sensitive to radiation due to previous procedures, disease, or genetic conditions.   IF your procedure requires a prolonged use of radiation, you will be notified and given written instructions for further action.  It is your responsibility to monitor the irradiated area for the 2 weeks following the procedure and to notify your physician if you are concerned that you have suffered a radiation induced injury.    All of the patient's questions were answered, patient is agreeable to proceed.  Consent signed and in chart.    Thank you for this interesting consult.  I greatly enjoyed meeting Sierra [name] and look forward to participating in their care.  A copy of this report was sent to the requesting provider on this date.  Electronically Signed: Willette Brace, PA-C 04/24/2023, 7:54 AM   I spent a total of    25 Minutes in face to face in clinical consultation, greater than 50% of which was counseling/coordinating care for diagnostic cerebral angiogram.   This chart was dictated using voice recognition software.  Despite best efforts to proofread,  errors can occur which can change the documentation meaning.

## 2023-04-23 NOTE — H&P (Shared)
Chief Complaint: Transient episodes of bilateral hand and facial paresthesias since January 18, 2023.  Found to have a subarachnoid hemorrhage. Patient presents for diagnostic cerebral angiogram   Referring Physician(s): Dr. Edwinna Areola  Supervising Physician: Julieanne Cotton  Patient Status: [facility] - Out-pt  History of Present Illness: Sierra [name] is a 82 y.o. female utpatient. History of breast cancer, HTN, HLD, CKD. Found to have a subarachnoid hemorrhage while being worked up for transient episodes of bilateral hand and facial paresthesias since January 18, 2023.  The patient was seen for consultation in the Neuro Interventional Radiology Clinic on 7.1.24 with NIR Attending Dr. Julieanne Cotton. At that time a detailed discussion regarding the Patient's medical condition including but not limited to possible treatment options took place. Following that discussion the Patient elected to proceed with diagnostic cerebral angiogram. The Patient presents today for diagnostic cerebral angiogram.  Currently without any significant complaints. Patient alert and laying in bed,calm. Denies any fevers, headache, chest pain, SOB, cough, abdominal pain, nausea, vomiting or bleeding. Return precautions and treatment recommendations and follow-up discussed with the patient *** who is agreeable with the plan.    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   PONV (postoperative nausea and vomiting)    extreme has put her in the hospital   Vitamin B12 deficiency 04/27/2021    Past Surgical History:  Procedure Laterality Date   BREAST LUMPECTOMY WITH RADIOACTIVE SEED LOCALIZATION Right  09/21/2020   Procedure: RIGHT BREAST LUMPECTOMY WITH RADIOACTIVE SEED LOCALIZATION;  Surgeon: Manus Rudd, MD;  Location: [facility];  Service: General;  Laterality: Right;   BREAST LUMPECTOMY WITH RADIOACTIVE SEED LOCALIZATION Right 07/13/2022   Procedure: RIGHT BREAST LUMPECTOMY WITH RADIOACTIVE SEED LOCALIZATION;  Surgeon: Manus Rudd, MD;  Location: [facility] OR;  Service: General;  Laterality: Right;   CATARACT EXTRACTION, BILATERAL Bilateral L 03-27-16  R 04-24-16   COLONOSCOPY     screenin gonly benign polyps removed   IR RADIOLOGIST EVAL & MGMT  04/10/2023   NEPHRECTOMY  1963   left   TUBAL LIGATION  1975    Allergies: Patient has no known allergies.  Medications: Prior to Admission medications   Medication Sig Start Date End Date Taking? Authorizing Provider  acetaminophen (TYLENOL) 500 MG tablet Take 500 mg by mouth every 6 (six) hours as needed for mild pain or headache.    [provider]  Calcium Carb-Cholecalciferol (CALCIUM + D3) 600-200 MG-UNIT TABS Take 1 tablet by mouth 2 (two) times daily.    [provider]  Coenzyme Q10 (CO Q 10 PO) Take 200 mg by mouth daily.    [provider]  divalproex (DEPAKOTE ER) 250 MG 24 hr tablet Take 1 tablet (250 mg total) by mouth daily. 02/07/23   Micki Riley, MD  docusate sodium (COLACE) 100 MG capsule Take 100 mg by mouth daily.    [provider]  levothyroxine (SYNTHROID) 50 MCG tablet Take 50 mcg by mouth every morning. 04/16/22   [provider]  Lysine 500 MG TABS Take 500 mg by mouth daily.    [provider]  metoCLOPramide (REGLAN) 10 MG tablet Take 0.5-1 tablets (5-10 mg total) by mouth every 6 (six) hours as needed for nausea (nausea/headache). 01/18/23   Arthor Captain, PA-C  Omega-3 Fatty Acids (FISH OIL) 1000 MG CAPS Take 1,000 mg by mouth daily.    [provider]  polyvinyl alcohol (LIQUIFILM TEARS) 1.4 % ophthalmic solution Place 1 drop into both  eyes 3 (three) times daily. [employer] brand    [provider]  propranolol ER (INDERAL LA) 120 MG 24 hr capsule TAKE 1 CAPSULE(120 MG) BY MOUTH DAILY 12/20/20   York Spaniel, MD  triamterene-hydrochlorothiazide (MAXZIDE-25) 37.5-25 MG tablet Take 1 tablet by mouth at bedtime. 09/06/15   Marinda Elk, MD  TURMERIC PO Take 1 capsule by mouth daily.    [provider]  vitamin B-12 (CYANOCOBALAMIN) 1000 MCG tablet Take 1,000 mcg by mouth daily.    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   Patient is left handed.    Social Determinants of Health   Financial Resource Strain: Not on file  Food Insecurity: Not on file  Transportation Needs: Not on file  Physical Activity: Not on file  Stress: Not on file  Social Connections: Not on file    ECOG Status: {CHL ONC ECOG ZO:[id number]}  Review of Systems: A 12 point ROS discussed and pertinent positives are indicated in the HPI above.  All other systems are negative.  Review of Systems  Vital Signs: There were no vitals taken for this visit.  Advance Care  Plan: {Advance Care WJXB:14782}    Physical Exam  Imaging: IR Radiologist Eval & Mgmt  Result Date: 04/10/2023 EXAM: NEW PATIENT OFFICE VISIT CHIEF COMPLAINT: History of subarachnoid hemorrhage. Current Pain Level: 1-10 HISTORY OF PRESENT ILLNESS: The patient is an 82 year old left handed lady who has been referred by Dr. Lucia Gaskins, her neurologist for evaluation of a recently discovered hemorrhage. History was obtained from the patient, and also from the patient's electronic medical records. The patient is initial symptoms started on January 18, 2023 at which time she experienced transient episodes of bilateral hand and facial paresthesias slightly worse on the right side. She has apparently had 3-4 these episodes almost identical and lasting less than 15 minutes. During one episode, patient appeared to have difficulty with speaking without altered mentation. No associated severe sudden headaches, nausea, vomiting, or photophobia or of loss of  consciousness. Subsequent CT of the brain followed by an MRI of the brain revealed right convexity sulcal subarachnoid hemorrhage with extension into the right parietooccipital cortical region. 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 The patient denies any associated palpitations, chest heaviness or pain or pedal edema. She denies any wheezing, coughing or sputum production. The patient reports her appetite and weight to be intact. Denies any difficulty swallowing solids or liquids. No history of abdominal pains, constipation, diarrhea or melena. Denies recent dysuria frequency of micturition, or of hematuria. Diagnosis * : Date . * : Cataract * : . * : Chronic kidney disease * : * : deformed left kidney- removed at age 15 . * : Common migraine * : 10/21/2015 . * : Hyperlipidemia * : . * : Hypertension * : . * : Macular degeneration * : * : Eyelea injections every 16  weeks . * : Osteoporosis * : * : take Reclast yearly . 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 * : PONV (postoperative nausea and vomiting) * : * : extreme has put her in the hospital . * : Vitamin B12 deficiency * : 04/27/2021 Socioeconomic History . * : Marital status: * : Divorced * : * : Spouse name: * : Not on file . * : Number of children: * : 2 . * : Years of education: * : 53 . * : Highest education level: * : Not on file Occupational History . * : Occupation: * : [employer] * : * : Comment: part time-8 hours a week Tobacco Use . * : Smoking status: * : Never * : * : Passive exposure: * : Never . * : Smokeless tobacco: * : Never Vaping Use . * : Vaping Use: * : Never used Substance and Sexual Activity . * : Alcohol use: * : No . * : Drug use: * : No . * : Sexual activity: * : Yes Other Topics * : Concern . * : Not on file Social History Narrative * : Lives at home w/ her daughter and grandson * : Patient drinks about 2 cups of caffeine daily. * : Patient is left handed. Food Insecurity: Not on file Transportation Needs: Not on file Physical Activity: Not on file Stress: Not on file Social Connections: Not on file Intimate Partner Violence: Not on file Medications: Current Outpatient Medications on File Prior to Visit Medication * : Sig * : Dispense * : Refill . * : acetaminophen (TYLENOL) 500 MG tablet * : Take 500 mg by mouth every 6 (six) hours as needed for mild pain or headache. * : * : . * : Calcium Carb-Cholecalciferol (CALCIUM + D3) 600-200 MG-UNIT TABS * : Take 1 tablet by mouth 2 (two) times daily. * : * : . * : Coenzyme Q10 (CO Q 10 PO) * : Take 200 mg by mouth daily. * : * : . * : docusate sodium (COLACE) 100 MG capsule * : Take 100 mg by mouth daily. * : * : . * : levothyroxine (SYNTHROID) 50 MCG tablet * : Take 50 mcg by mouth every morning. * : * : . * : Lysine 500 MG TABS * : Take 500 mg by mouth daily. * : * : . * : metoCLOPramide (REGLAN) 10 MG  tablet * : Take 0.5-1 tablets (5-10 mg total) by mouth  every 6 (six) hours as needed for nausea (nausea/headache). * : 10 tablet * : 0 . * : Omega-3 Fatty Acids (FISH OIL) 1000 MG CAPS * : Take 1,000 mg by mouth daily. * : * : . * : polyvinyl alcohol (LIQUIFILM TEARS) 1.4 % ophthalmic solution * : Place 1 drop into both eyes 3 (three) times daily. [employer] brand * : * : . * : propranolol ER (INDERAL LA) 120 MG 24 hr capsule * : TAKE 1 CAPSULE(120 MG) BY MOUTH DAILY * : 90 capsule * : 2 . * : triamterene-hydrochlorothiazide (MAXZIDE-25) 37.5-25 MG tablet * : Take 1 tablet by mouth at bedtime. * : * : . * : TURMERIC PO * : Take 1 capsule by mouth daily. * : * : . * : vitamin B-12 (CYANOCOBALAMIN) 1000 MCG tablet * : Take 1,000 mcg by mouth daily. * : * : Allergies:  No Known Allergies. REVIEW OF SYSTEMS: Negative unless as mentioned above. PHYSICAL EXAMINATION: Alert,awake,oriented to time,place,space. Speech and comprehension intact. Normal eye contact. Appears to be in no acute distress. No gross lateralizing abnormal neurological features noted. Station and gait grossly normal. ASSESSMENT AND PLAN: The patient's recent neuroimaging studies including MRI scan, CT angiogram of the head and neck, and CT of the brain were reviewed with the patient. Upon reviewing the neuroimaging studies and the patient's clinical history and examination, the subarachnoid hemorrhage in the right pre central sulcus region and the surrounding subarachnoid region as seen on the SWI images, was likely related to venous pathology such as a small cortical venous occlusion with resultant hemorrhage, versus rupture of a cortical vein. An underlying arteriovenous fistulous communication such as a dural AV fistula could not be entirely excluded on the recent studies. For more accurate analysis, a formal diagnostic catheter arteriogram would be needed. The procedure was reviewed in detail with the patient. This would be an outpatient procedure  under conscious sedation. Patient will be provided with instructions prior to the procedure. The procedure would be either via a trans radial, or transfemoral route. Following the procedure the patient would be observed for 3-4 hours in short-stay. Thereafter, the patient will be discharged home. A very small risk of an ischemic stroke, or vessel injury related to vascular access, and of renal function deterioration related to the contrast was reviewed. Questions were answered to the patient's satisfaction. The patient expressed her desire to proceed with a diagnostic catheter arteriogram as described. This will be scheduled as soon as possible. Electronically Signed   By: Julieanne Cotton M.D.   On: 04/10/2023 07:54   EEG adult        [facility] [address] [place]. [place] [address] [phone]      Electroencephalogram Procedure Note [name] Date of Birth:  [birth year] Medical Record Number:  [id number] Indications: Diagnostic Date of Procedure 6/25 2024 Medications: none Clinical history : 82 year old patient with history of TIA being evaluated for seizures Technical Description This study was performed using 17 channel digital electroencephalographic recording equipment. International 10-20 electrode placement was used. The record was obtained with the patient awake, drowsy, and asleep.  The record is of good technical quality for purposes of interpretation. Activation Procedures:  photic stimulation . EEG Description Awake: Alpha Activity: The waking state record contains a well-defined bi-occipital alpha rhythm of  moderate amplitude with a dominant frequency of 8 Hz. Reactivity is uncertain. No paroxsymal activity, spikes, or sharp waves are noted. Technical component of study  is adequate. EKG tracing shows regular sinus rhythm Length of this recording is 26 minutes and 3 seconds Sleep: With drowsiness, there is attenuation of the background alpha activity. As the  patient enters into light sleep, vertex waves and symmetrical spindles are noted. K complexes are noted in sleep. Transition to the waking state is unremarkable. Result of Activation Procedures: Hyperventilation: N/A. Photo Stimulation: No photic driving response is noted. Summary Normal electroencephalogram, awake, asleep and with activation procedures. There are no focal lateralizing or epileptiform features.    Labs:  CBC: Recent Labs    07/07/22 1013 01/18/23 1558  WBC 5.6 5.0  HGB 13.4 12.6  HCT 40.1 38.8  PLT 193 173    COAGS: No results for input(s): "INR", "APTT" in the last 8760 hours.  BMP: Recent Labs    07/07/22 1013 01/18/23 1558  NA 139 140  K 4.3 3.6  CL 106 96*  CO2 27 25  GLUCOSE 97 99  BUN 20 26*  CALCIUM 10.0 9.6  CREATININE 1.10* 0.99  GFRNONAA 50* 57*    LIVER FUNCTION TESTS: Recent Labs    01/18/23 1558  BILITOT 0.6  AST 23  ALT 16  ALKPHOS 47  PROT 6.3*  ALBUMIN 3.7      Assessment and Plan:  82 y.o. female outpatient. History of breast cancer, HTN, HLD, CKD. Found to have a subarachnoid hemorrhage while being worked up for transient episodes of bilateral hand and facial paresthesias since January 18, 2023.  The patient was seen for consultation in the Neuro Interventional Radiology Clinic on 7.1.24 with NIR Attending Dr. Julieanne Cotton. At that time a detailed discussion regarding the Patient's medical condition including but not limited to possible treatment options took place. Following that discussion the Patient elected to proceed with diagnostic cerebral angiogram. The Patient presents today for diagnostic cerebral angiogram.  Risks and benefits of *** were discussed with the patient including, but not limited to bleeding, infection, vascular injury or contrast induced renal failure.  This interventional procedure involves the use of X-rays and because of the nature of the planned procedure, it is possible that we will have prolonged  use of X-ray fluoroscopy.  Potential radiation risks to you include (but are not limited to) the following: - A slightly elevated risk for cancer  several years later in life. This risk is typically less than 0.5% percent. This risk is low in comparison to the normal incidence of human cancer, which is 33% for women and 50% for men according to the [employer] Society. - Radiation induced injury can include skin redness, resembling a rash, tissue breakdown / ulcers and hair loss (which can be temporary or permanent).   The likelihood of either of these occurring depends on the difficulty of the procedure and whether you are sensitive to radiation due to previous procedures, disease, or genetic conditions.   IF your procedure requires a prolonged use of radiation, you will be notified and given written instructions for further action.  It is your responsibility to monitor the irradiated area for the 2 weeks following the procedure and to notify your physician if you are concerned that you have suffered a radiation induced injury.    All of the patient's questions were answered, patient is agreeable to proceed.  Consent signed and in chart.  ***    Thank you for this interesting consult.  I greatly enjoyed meeting Sierra [name] and look forward to participating in their care.  A copy of this  report was sent to the requesting provider on this date.  Electronically Signed: Alene Mires, NP 04/23/2023, 5:22 PM   I spent a total of {New FIEP:[id number]} {New Out-Pt:304952002}  {Established Out-Pt:304952003} in face to face in clinical consultation, greater than 50% of which was counseling/coordinating care for ***

## 2023-04-24 ENCOUNTER — Encounter (HOSPITAL_COMMUNITY): Payer: Self-pay

## 2023-04-24 ENCOUNTER — Other Ambulatory Visit (HOSPITAL_COMMUNITY): Payer: Self-pay | Admitting: Interventional Radiology

## 2023-04-24 ENCOUNTER — Other Ambulatory Visit: Payer: Self-pay

## 2023-04-24 ENCOUNTER — Ambulatory Visit (HOSPITAL_COMMUNITY)
Admission: RE | Admit: 2023-04-24 | Discharge: 2023-04-24 | Disposition: A | Discharge status: Home / Self Care | Payer: Federal, State, Local not specified - PPO | Source: Ambulatory Visit | Attending: Interventional Radiology | Admitting: Interventional Radiology

## 2023-04-24 DIAGNOSIS — N189 Chronic kidney disease, unspecified: Secondary | ICD-10-CM | POA: Insufficient documentation

## 2023-04-24 DIAGNOSIS — I773 Arterial fibromuscular dysplasia: Secondary | ICD-10-CM | POA: Diagnosis not present

## 2023-04-24 DIAGNOSIS — E785 Hyperlipidemia, unspecified: Secondary | ICD-10-CM | POA: Insufficient documentation

## 2023-04-24 DIAGNOSIS — Z853 Personal history of malignant neoplasm of breast: Secondary | ICD-10-CM | POA: Insufficient documentation

## 2023-04-24 DIAGNOSIS — I129 Hypertensive chronic kidney disease with stage 1 through stage 4 chronic kidney disease, or unspecified chronic kidney disease: Secondary | ICD-10-CM | POA: Insufficient documentation

## 2023-04-24 DIAGNOSIS — Z8679 Personal history of other diseases of the circulatory system: Secondary | ICD-10-CM | POA: Insufficient documentation

## 2023-04-24 DIAGNOSIS — Z01818 Encounter for other preprocedural examination: Secondary | ICD-10-CM

## 2023-04-24 HISTORY — PX: IR ANGIO VERTEBRAL SEL VERTEBRAL BILAT MOD SED: IMG5369

## 2023-04-24 LAB — BASIC METABOLIC PANEL
Anion gap: 12 (ref 5–15)
BUN: 25 mg/dL — ABNORMAL HIGH (ref 8–23)
CO2: 23 mmol/L (ref 22–32)
Calcium: 9.8 mg/dL (ref 8.9–10.3)
Chloride: 104 mmol/L (ref 98–111)
Creatinine, Ser: 1.11 mg/dL — ABNORMAL HIGH (ref 0.44–1.00)
GFR, Estimated: 50 mL/min — ABNORMAL LOW (ref >60)
Glucose, Bld: 92 mg/dL (ref 70–99)
Potassium: 3.4 mmol/L — ABNORMAL LOW (ref 3.5–5.1)
Sodium: 139 mmol/L (ref 135–145)

## 2023-04-24 LAB — CBC
HCT: 40 % (ref 36.0–46.0)
Hemoglobin: 12.9 g/dL (ref 12.0–15.0)
MCH: 29.4 pg (ref 26.0–34.0)
MCHC: 32.3 g/dL (ref 30.0–36.0)
MCV: 91.1 fL (ref 80.0–100.0)
Platelets: 175 10*3/uL (ref 150–400)
RBC: 4.39 MIL/uL (ref 3.87–5.11)
RDW: 13.4 % (ref 11.5–15.5)
WBC: 5.4 10*3/uL (ref 4.0–10.5)
nRBC: 0 % (ref 0.0–0.2)

## 2023-04-24 LAB — PROTIME-INR
INR: 1 (ref 0.8–1.2)
Prothrombin Time: 13.1 seconds (ref 11.4–15.2)

## 2023-04-24 MED ORDER — NITROGLYCERIN 1 MG/10 ML FOR IR/CATH LAB: 100.0000 ug | Freq: Once | INTRA_ARTERIAL | Status: DC

## 2023-04-24 MED ORDER — MIDAZOLAM HCL 2 MG/2ML IJ SOLN
INTRAMUSCULAR | Status: AC
  Filled 2023-04-24: qty 2

## 2023-04-24 MED ORDER — VERAPAMIL HCL 2.5 MG/ML IV SOLN: 5.0000 mg | Freq: Once | INTRAVENOUS | Status: DC

## 2023-04-24 MED ORDER — HEPARIN SODIUM (PORCINE) 1000 UNIT/ML IJ SOLN
INTRAMUSCULAR | Status: AC | PRN
  Administered 2023-04-24: 1000 [IU] via INTRAVENOUS

## 2023-04-24 MED ORDER — SODIUM CHLORIDE 0.9 % IV SOLN
INTRAVENOUS | Status: AC | PRN
  Administered 2023-04-24: 250 mL via INTRAVENOUS

## 2023-04-24 MED ORDER — FENTANYL CITRATE (PF) 100 MCG/2ML IJ SOLN
INTRAMUSCULAR | Status: AC
  Filled 2023-04-24: qty 2

## 2023-04-24 MED ORDER — MIDAZOLAM HCL 2 MG/2ML IJ SOLN
INTRAMUSCULAR | Status: AC | PRN
  Administered 2023-04-24: 1 mg via INTRAVENOUS

## 2023-04-24 MED ORDER — VERAPAMIL HCL 2.5 MG/ML IV SOLN
INTRAVENOUS | Status: AC
  Filled 2023-04-24: qty 2

## 2023-04-24 MED ORDER — HEPARIN SODIUM (PORCINE) 1000 UNIT/ML IJ SOLN: 10000.0000 [IU] | Freq: Once | INTRAMUSCULAR | Status: DC

## 2023-04-24 MED ORDER — NITROGLYCERIN 1 MG/10 ML FOR IR/CATH LAB
INTRA_ARTERIAL | Status: AC
  Filled 2023-04-24: qty 10

## 2023-04-24 MED ORDER — HEPARIN SODIUM (PORCINE) 1000 UNIT/ML IJ SOLN
INTRAMUSCULAR | Status: AC
  Filled 2023-04-24: qty 10

## 2023-04-24 MED ORDER — SODIUM CHLORIDE 0.9 % IV SOLN: INTRAVENOUS | Status: DC

## 2023-04-24 MED ORDER — LIDOCAINE HCL 1 % IJ SOLN
4.0000 mL | Freq: Once | INTRAMUSCULAR | Status: AC
  Administered 2023-04-24: 4 mL via INTRADERMAL

## 2023-04-24 MED ORDER — SODIUM CHLORIDE 0.9 % IV SOLN: INTRAVENOUS | Status: AC

## 2023-04-24 MED ORDER — FENTANYL CITRATE (PF) 100 MCG/2ML IJ SOLN
INTRAMUSCULAR | Status: AC | PRN
  Administered 2023-04-24: 25 ug via INTRAVENOUS

## 2023-04-24 MED ORDER — LIDOCAINE HCL 1 % IJ SOLN
INTRAMUSCULAR | Status: AC
  Filled 2023-04-24: qty 20

## 2023-04-24 NOTE — Progress Notes (Signed)
 Up and walked and tolerated well; right groin stable, no bleeding or hematoma 

## 2023-04-24 NOTE — Sedation Documentation (Signed)
Knee immobilizer placed on R leg per MD post IR procedure with R femoral arterial access.

## 2023-04-24 NOTE — Procedures (Signed)
INR.   Preliminary   Status post bilateral common carotid arteriogram and right vertebral artery angiogram.  Right CFA approach.  Findings.  1.  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  2.  No arteriovenous malformations, dural AV fistula, aneurysms or dissections noted.  3.  Venous outflow within normal limits.  Fatima Sanger MD

## 2023-04-24 NOTE — Progress Notes (Signed)
Orthopedic Tech Progress Note Patient Details:  Sierra [name] Nov 21, 1940 [id number]  IR RN called requesting a KNEE IMMOBILIZER, dropped off to room 3  Ortho Devices Type of Ortho Device: Knee Immobilizer Ortho Device/Splint Interventions: Ordered, Other (comment)   Post Interventions Patient Tolerated: Other (comment) Instructions Provided: Other (comment)  [name] 04/24/2023, 9:55 AM

## 2023-04-25 ENCOUNTER — Emergency Department (HOSPITAL_COMMUNITY): Payer: Federal, State, Local not specified - PPO

## 2023-04-25 ENCOUNTER — Other Ambulatory Visit: Payer: Self-pay

## 2023-04-25 ENCOUNTER — Emergency Department (HOSPITAL_COMMUNITY)
Admission: EM | Admit: 2023-04-25 | Discharge: 2023-04-25 | Disposition: A | Discharge status: Home / Self Care | Payer: Federal, State, Local not specified - PPO | Attending: Emergency Medicine | Admitting: Emergency Medicine

## 2023-04-25 ENCOUNTER — Encounter (HOSPITAL_COMMUNITY): Payer: Self-pay

## 2023-04-25 DIAGNOSIS — N189 Chronic kidney disease, unspecified: Secondary | ICD-10-CM | POA: Diagnosis not present

## 2023-04-25 DIAGNOSIS — I129 Hypertensive chronic kidney disease with stage 1 through stage 4 chronic kidney disease, or unspecified chronic kidney disease: Secondary | ICD-10-CM | POA: Insufficient documentation

## 2023-04-25 DIAGNOSIS — R569 Unspecified convulsions: Secondary | ICD-10-CM

## 2023-04-25 DIAGNOSIS — Z79899 Other long term (current) drug therapy: Secondary | ICD-10-CM | POA: Insufficient documentation

## 2023-04-25 DIAGNOSIS — I680 Cerebral amyloid angiopathy: Secondary | ICD-10-CM | POA: Diagnosis not present

## 2023-04-25 DIAGNOSIS — R202 Paresthesia of skin: Secondary | ICD-10-CM | POA: Insufficient documentation

## 2023-04-25 DIAGNOSIS — E859 Amyloidosis, unspecified: Secondary | ICD-10-CM | POA: Diagnosis not present

## 2023-04-25 HISTORY — PX: IR ANGIO INTRA EXTRACRAN SEL COM CAROTID INNOMINATE BILAT MOD SED: IMG5360

## 2023-04-25 LAB — CBC WITH DIFFERENTIAL/PLATELET
Abs Immature Granulocytes: 0.01 10*3/uL (ref 0.00–0.07)
Basophils Absolute: 0 10*3/uL (ref 0.0–0.1)
Basophils Relative: 0 %
Eosinophils Absolute: 0.3 10*3/uL (ref 0.0–0.5)
Eosinophils Relative: 5 %
HCT: 37.4 % (ref 36.0–46.0)
Hemoglobin: 12.1 g/dL (ref 12.0–15.0)
Immature Granulocytes: 0 %
Lymphocytes Relative: 20 %
Lymphs Abs: 1.2 10*3/uL (ref 0.7–4.0)
MCH: 29.2 pg (ref 26.0–34.0)
MCHC: 32.4 g/dL (ref 30.0–36.0)
MCV: 90.3 fL (ref 80.0–100.0)
Monocytes Absolute: 0.7 10*3/uL (ref 0.1–1.0)
Monocytes Relative: 11 %
Neutro Abs: 3.7 10*3/uL (ref 1.7–7.7)
Neutrophils Relative %: 64 %
Platelets: 174 10*3/uL (ref 150–400)
RBC: 4.14 MIL/uL (ref 3.87–5.11)
RDW: 13.6 % (ref 11.5–15.5)
WBC: 5.9 10*3/uL (ref 4.0–10.5)
nRBC: 0 % (ref 0.0–0.2)

## 2023-04-25 LAB — COMPREHENSIVE METABOLIC PANEL
ALT: 14 U/L (ref 0–44)
AST: 22 U/L (ref 15–41)
Albumin: 3.4 g/dL — ABNORMAL LOW (ref 3.5–5.0)
Alkaline Phosphatase: 55 U/L (ref 38–126)
Anion gap: 8 (ref 5–15)
BUN: 22 mg/dL (ref 8–23)
CO2: 22 mmol/L (ref 22–32)
Calcium: 8.5 mg/dL — ABNORMAL LOW (ref 8.9–10.3)
Chloride: 109 mmol/L (ref 98–111)
Creatinine, Ser: 0.94 mg/dL (ref 0.44–1.00)
GFR, Estimated: >60 mL/min (ref >60)
Glucose, Bld: 108 mg/dL — ABNORMAL HIGH (ref 70–99)
Potassium: 3.5 mmol/L (ref 3.5–5.1)
Sodium: 139 mmol/L (ref 135–145)
Total Bilirubin: 0.9 mg/dL (ref 0.3–1.2)
Total Protein: 5.9 g/dL — ABNORMAL LOW (ref 6.5–8.1)

## 2023-04-25 LAB — MAGNESIUM: Magnesium: 1.8 mg/dL (ref 1.7–2.4)

## 2023-04-25 MED ORDER — DIVALPROEX SODIUM ER 500 MG PO TB24
500.0000 mg | ORAL_TABLET | Freq: Every day | ORAL | Status: DC
  Administered 2023-04-25: 500 mg via ORAL
  Filled 2023-04-25: qty 1

## 2023-04-25 MED ORDER — GADOBUTROL 1 MMOL/ML IV SOLN
6.0000 mL | Freq: Once | INTRAVENOUS | Status: AC | PRN
  Administered 2023-04-25: 6 mL via INTRAVENOUS

## 2023-04-25 MED ORDER — DIVALPROEX SODIUM ER 500 MG PO TB24: 500.0000 mg | ORAL_TABLET | Freq: Every day | ORAL | 0 refills | Status: AC

## 2023-04-25 MED ORDER — DIVALPROEX SODIUM ER 250 MG PO TB24: 250.0000 mg | ORAL_TABLET | Freq: Every day | ORAL | 3 refills | Status: DC

## 2023-04-25 NOTE — ED Provider Notes (Signed)
  Physical Exam  BP 127/68 (BP Location: Right Arm)   Pulse 66   Temp 98.3 F (36.8 C) (Oral)   Resp 18   Ht 1.575 m (5\' 2" )   Wt 59 kg   SpO2 95%   BMI 23.78 kg/m   Physical Exam  Procedures  Procedures  ED Course / MDM   Clinical Course as of 04/25/23 1358  Wed Apr 25, 2023  0743 Spoke with neurology, agree w/ MRI w/wo, abnormality could be 2/2 contrast from arteriogram yesterday. Dr Derry Lory.  [SG]    Clinical Course User Index [SG] Sloan Leiter, DO   Medical Decision Making Amount and/or Complexity of Data Reviewed Labs: ordered. Radiology: ordered.  Risk Prescription drug management.   82 yo female ho sah 6 weeks ago, with some residual deficits. Now with left arm paresthesias and movement, arteriogram yesterday per patient, by IR. Presented with above symptoms at 0200 lasted 2-3 minutes, then awoke again at 0400 aysmptomatic and came in for evaluation. No symptoms on exam per Dr.  Maury Dus MR with and without contrast. Chat received from Dr. Derry Lory- blood vs leptomeningioma. He is coming to evaluate Patient awake and alert VSS.  Son at bedside. She has no complaints at this time.  Left hand now back to full strength. Patient informed of results and plan for further evaluation by neurology. Discussed with Ddr.Khaliqdina and he advises below: He saw and started on depakote.  ? Seizure, eeg negative, ? Of complicated migraines and depakote potential to improve both of the above.  Per neurology, patient stable for d/c home and will d/c on depakote.       Sierra Grizzle, MD 04/25/23 [phone]

## 2023-04-25 NOTE — ED Notes (Signed)
 EDP at bedside  

## 2023-04-25 NOTE — ED Notes (Signed)
Patient transported to MRI 

## 2023-04-25 NOTE — Consult Note (Addendum)
NEUROLOGY CONSULTATION NOTE   Date of service: April 25, 2023 Patient Name: Sierra [name] MRN:  [id number] DOB:  08-Dec-1940 Reason for consult: "MRI with ?worsening SAH, brief episode of LUE numbness and weakness x 2" Requesting Provider: Margarita Grizzle, MD _ _ _   _ __   _ __ _ _  __ __   _ __   __ _  History of Present Illness  Sierra [name] is a 82 y.o. female with PMH significant for SAH, TIA like episodes, HTN, HLD, migraines, B12 def  who presents with 2 episodes of LUE weakness and numbness. She had a cerebral angiogram yesterday afternoon. Was sleeping at her son's place so she can be monitored for the next 48hours. Overnight at around 0200, woke up with numbness/tingling in L arm and L arm was heavy and difficult to coordinate. Felt like it was doing its own thing. This lasted 15 mins and then she went to sleep. She woke up an hour later and had the exact same episode. This lasted for 15 mins again and then went away. No headache, was not bearing down.  She had CT Head with hyperdense material in the high right frontal sulci.  She had MRI brain with and without contrast which demonstrated scattered strokes in bilateral hemispheres and right cerebellar hemisphere along with increase in the subarachnoid hemorrhage in the right cerebral hemisphere along with stable hemorrhage in the left temporal and bilateral parietal occipital region.  There is also some increased leptomeningeal contrast-enhancement which is likely reactive in the setting of subarachnoid.  Patient denies any current symptoms and is pretty much back to her baseline.   ROS   Constitutional Denies weight loss, fever and chills.   HEENT Denies changes in vision and hearing.   Respiratory Denies SOB and cough.   CV Denies palpitations and CP   GI Denies abdominal pain, nausea, vomiting and diarrhea.   GU Denies dysuria and urinary frequency.   MSK Denies myalgia and joint pain.   Skin Denies rash and pruritus.    Neurological Denies headache and syncope.   Psychiatric Denies recent changes in mood. Denies anxiety and depression.    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   PONV (postoperative nausea and vomiting)    extreme has put her in the hospital   Vitamin B12 deficiency 04/27/2021   Past Surgical History:  Procedure Laterality Date   BREAST LUMPECTOMY WITH RADIOACTIVE SEED LOCALIZATION Right 09/21/2020   Procedure: RIGHT BREAST LUMPECTOMY WITH RADIOACTIVE SEED LOCALIZATION;  Surgeon: Manus Rudd, MD;  Location: [facility];  Service: General;  Laterality: Right;   BREAST LUMPECTOMY WITH RADIOACTIVE SEED LOCALIZATION Right 07/13/2022   Procedure: RIGHT BREAST LUMPECTOMY WITH RADIOACTIVE SEED LOCALIZATION;  Surgeon: Manus Rudd, MD;  Location: [facility] OR;  Service: General;  Laterality: Right;   CATARACT EXTRACTION, BILATERAL Bilateral L 03-27-16  R 04-24-16   COLONOSCOPY     screenin gonly benign polyps removed   IR ANGIO INTRA EXTRACRAN SEL COM CAROTID INNOMINATE BILAT MOD SED  04/25/2023   IR ANGIO VERTEBRAL SEL VERTEBRAL BILAT MOD  SED  04/24/2023   IR RADIOLOGIST EVAL & MGMT  04/10/2023   NEPHRECTOMY  1963   left   TUBAL LIGATION  1975   Family History  Problem Relation Age of Onset   Thyroid disease Mother    Dementia Father    Migraines Father    Colon cancer Neg Hx    Esophageal cancer Neg Hx    Stomach cancer Neg Hx    Rectal cancer Neg Hx    Social History   Socioeconomic History   Marital status: Widowed    Spouse name: Not on file   Number of children: 2   Years of education: 69   Highest education level: Not on file   Occupational History   Occupation: Technical brewer    Comment: part time-8 hours a week  Tobacco Use   Smoking status: Never    Passive exposure: Never   Smokeless tobacco: Never  Vaping Use   Vaping status: Never Used  Substance and Sexual Activity   Alcohol use: No   Drug use: No   Sexual activity: Yes  Other Topics Concern   Not on file  Social History Narrative   Lives at home w/ her daughter and grandson   Patient drinks about 2 cups of caffeine daily.   Patient is left handed.    Social Determinants of Health   Financial Resource Strain: Not on file  Food Insecurity: Not on file  Transportation Needs: Not on file  Physical Activity: Not on file  Stress: Not on file  Social Connections: Not on file   No Known Allergies  Medications  (Not in a hospital admission)    Vitals   Vitals:   04/25/23 0845 04/25/23 0915 04/25/23 1146 04/25/23 1246  BP: 113/65 121/66 127/68   Pulse: 65 64 66   Resp: 14  18   Temp: 98.4 F (36.9 C)   98.3 F (36.8 C)  TempSrc: Oral   Oral  SpO2: 96% 97% 95%   Weight:      Height:         Body mass index is 23.78 kg/m.  Physical Exam   General: Laying comfortably in bed; in no acute distress.  HENT: Normal oropharynx and mucosa. Normal external appearance of ears and nose.  Neck: Supple, no pain or tenderness  CV: No JVD. No peripheral edema.  Pulmonary: Symmetric Chest rise. Normal respiratory effort.  Abdomen: Soft to touch, non-tender.  Ext: No cyanosis, edema, or deformity  Skin: No rash. Normal palpation of skin.   Musculoskeletal: Normal digits and nails by inspection. No clubbing.   Neurologic Examination  Mental status/Cognition: Alert, oriented to self, place, month and year, good attention.  Speech/language: Fluent, comprehension intact, object naming intact, repetition intact.  Cranial nerves:   CN II Pupils equal and reactive to light, no VF deficits    CN III,IV,VI EOM intact, no gaze preference or  deviation, no nystagmus    CN V normal sensation in V1, V2, and V3 segments bilaterally    CN VII no asymmetry, no nasolabial fold flattening    CN VIII normal hearing to speech    CN IX & X normal palatal elevation, no uvular deviation    CN XI 5/5 head turn and 5/5 shoulder shrug bilaterally    CN XII midline tongue protrusion    Motor:  Muscle bulk: normal, tone normal, pronator drift none tremor none Mvmt Root Nerve  Muscle Right Left Comments  SA C5/6 Ax Deltoid 5  5   EF C5/6 Mc Biceps 5 5   EE C6/7/8 Rad Triceps 5 5   WF C6/7 Med FCR     WE C7/8 PIN ECU     F Ab C8/T1 U ADM/FDI     HF L1/2/3 Fem Illopsoas 5 5   KE L2/3/4 Fem Quad 5 5   DF L4/5 D Peron Tib Ant 5 5   PF S1/2 Tibial Grc/Sol 5 5    Sensation:  Light touch Intact throughout   Pin prick    Temperature    Vibration   Proprioception    Coordination/Complex Motor:  - Finger to Nose intact bilaterally - Heel to shin intact bilaterally - Rapid alternating movement are normal - Gait: deferred for patient safety.  Labs   CBC:  Recent Labs  Lab 04/24/23 0702 04/25/23 0504  WBC 5.4 5.9  NEUTROABS  --  3.7  HGB 12.9 12.1  HCT 40.0 37.4  MCV 91.1 90.3  PLT 175 174    Basic Metabolic Panel:  Lab Results  Component Value Date   NA 139 04/25/2023   K 3.5 04/25/2023   CO2 22 04/25/2023   GLUCOSE 108 (H) 04/25/2023   BUN 22 04/25/2023   CREATININE 0.94 04/25/2023   CALCIUM 8.5 (L) 04/25/2023   GFRNONAA >60 04/25/2023   GFRAA 51 (L) 07/14/2019   Lipid Panel:  Lab Results  Component Value Date   LDLCALC 104 (H) 09/01/2015   HgbA1c:  Lab Results  Component Value Date   HGBA1C 6.0 (H) 09/01/2015   Urine Drug Screen: No results found for: "LABOPIA", "COCAINSCRNUR", "LABBENZ", "AMPHETMU", "THCU", "LABBARB"  Alcohol Level     Component Value Date/Time   ETH <10 07/14/2019 1040    CT Head without contrast(Personally reviewed): High-density material within 2 high right frontal sulci,  increased extent from prior MR and CT imaging. There is either recurrent subarachnoid hemorrhage or alternate subarachnoid space pathology, recommend updated brain MRI with contrast.  MRI Brain(Personally reviewed): 1. 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 2. Compared to prior exam there is increased subarachnoid hemorrhage in the right cerebral hemisphere, particularly along the posterior aspect of the right frontal lobe. Additional previously seen sites of subarachnoid hemorrhage in the left temporal lobe and the bilateral parieto-occipital region is unchanged. Given recurrent SAH with a negative catheter angiogram, findings could be related to RCVS or nonspecific vasculitis. 3. Compared to prior exam there is new/increased leptomeningeal contrast enhancement in the posterior right frontal lobe. This is nonspecific and may be reactive in the setting of SAH. Recommend short-term follow-up MRI with and without contrast in 3 months to assess for interval change.  Impression   Sierra [name] is a 82 y.o. female with PMH significant for SAH, TIA like episodes, HTN, HLD, migraines, B12 def  who presents with 2 episodes of LUE weakness and numbness. Episodes lasted 15 mins and spontaneously resolved. No headache. MRI Brain here shows increased SAH in the R frontal sulci and stable hemosiderin staining in previously known left temporal and BL parieto-occipital regions.  She denies any thunderclap headaches, no obvious vasoconstrictor triggers for RCVS. I discussed this with Dr. 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 She does have areas of lobar hemorrhages in the temporal lobe.  As for her strokes, likely periprocedural in the setting of angiogram.  With negative angiogram yesterday, RCVS is low on the differerential. No  aneurysms, AVM identified on the  angiogram yesterday.  It is very possible that they episode of L arm jerkiness/numbness that she had earlier in the AM, was a possible focal seizure.  Recommendations  - Start Depakote ER 500mg  daily. Should help with both migraines as well as potential seizures - routine EEG with left temporal slowing but no epileptiform discharges. - follow up with Dr. Delia Heady with [facility] in the next 2 weeks. - Return precautions were discussed with patient and she was encouraged to return to the ED if she has any episodes of focal neuro deficits or headaches. ______________________________________________________________________  Plan discussed with Dr. Rosalia Hammers with the ED team. Okay to discharge from a neuro standpoint.  Thank you for the opportunity to take part in the care of this patient. If you have any further questions, please contact the neurology consultation attending.  Signed,  Erick Blinks [facility] _ _ _   _ __   _ __ _ _  __ __   _ __   __ _

## 2023-04-25 NOTE — ED Triage Notes (Signed)
Says at 0200 today she began having left arm and facial numbness. Episode lasted around 15 minutes.   Was discharged yesterday after seeing IR for an arteriogram.    Says she has had a very recent subarachnoid hemorrhage and  has been following up as directed.   Continues to say that she has complex migraines that mimic strokes sometimes but usually does not cause arm numbness or for her to randomly drop objects.

## 2023-04-25 NOTE — Progress Notes (Signed)
EEG complete - results pending 

## 2023-04-25 NOTE — ED Provider Notes (Signed)
[place] EMERGENCY DEPARTMENT AT [facility] Provider Note  CSN: [id number] Arrival date & time: 04/25/23 4098  Chief Complaint(s) Extremity Weakness  HPI Sierra [name] is a 82 y.o. female with past medical history as below, significant for complicated migraine, SAH, CKD, HLD, HTN who presents to the ED with complaint of arm tingling/pain/headache. Onset around 0200, she woke up with left arm tingling, felt as though her arm jerked rapidly spontaneously, had mild headache and some slight weakness to her left arm, o/w felt normal. Symptoms lasted <5 minutes, she went back to sleep and woke again around 0400, she was asymptomatic at that time, was worried about symptoms and came to ED for eval. 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 Authorizing Provider  acetaminophen (TYLENOL) 500 MG tablet Take 500 mg by mouth every 6 (six) hours as needed for mild pain or headache.    [provider]  Calcium Carb-Cholecalciferol (CALCIUM + D3) 600-200 MG-UNIT TABS Take 1 tablet by mouth 2 (two) times daily.    [provider]  Coenzyme Q10 (CO Q 10 PO) Take 200 mg by mouth daily.    [provider]  divalproex (DEPAKOTE ER) 250 MG 24 hr tablet Take 1 tablet (250 mg total) by mouth daily. 02/07/23   Micki Riley, MD  docusate sodium (COLACE) 100 MG capsule Take 100 mg by mouth daily.    [provider]  levothyroxine (SYNTHROID) 50 MCG tablet Take 50 mcg by mouth every morning. 04/16/22   [provider]  Lysine 500 MG TABS Take 500 mg by mouth daily.    [provider]  metoCLOPramide (REGLAN) 10 MG tablet Take 0.5-1 tablets (5-10 mg total) by mouth every 6 (six) hours as needed for nausea (nausea/headache). 01/18/23   Arthor Captain, PA-C  Omega-3 Fatty Acids (FISH OIL) 1000 MG CAPS Take 1,000 mg by mouth daily.    [provider]  polyvinyl alcohol (LIQUIFILM TEARS) 1.4 % ophthalmic solution Place 1 drop into both eyes 3 (three) times daily. [employer] brand    [provider]  propranolol ER (INDERAL LA) 120 MG 24 hr capsule TAKE 1 CAPSULE(120 MG) BY MOUTH DAILY 12/20/20   York Spaniel, MD  triamterene-hydrochlorothiazide (MAXZIDE-25) 37.5-25 MG tablet Take 1 tablet  by mouth at bedtime. 09/06/15   Marinda Elk, MD  TURMERIC PO Take 1 capsule by mouth daily.    [provider]  vitamin B-12 (CYANOCOBALAMIN) 1000 MCG tablet Take 1,000 mcg by mouth daily.    [provider]                                                                                                                                    Past Surgical History Past Surgical History:  Procedure Laterality Date   BREAST LUMPECTOMY WITH RADIOACTIVE SEED LOCALIZATION Right  09/21/2020   Procedure: RIGHT BREAST LUMPECTOMY WITH RADIOACTIVE SEED LOCALIZATION;  Surgeon: Manus Rudd, MD;  Location: [facility];  Service: General;  Laterality: Right;   BREAST LUMPECTOMY WITH RADIOACTIVE SEED LOCALIZATION Right 07/13/2022   Procedure: RIGHT BREAST LUMPECTOMY WITH RADIOACTIVE SEED LOCALIZATION;  Surgeon: Manus Rudd, MD;  Location: [facility] OR;  Service: General;  Laterality: Right;   CATARACT EXTRACTION, BILATERAL Bilateral L 03-27-16  R 04-24-16   COLONOSCOPY     screenin gonly benign polyps removed   IR RADIOLOGIST EVAL & MGMT  04/10/2023   NEPHRECTOMY  1963   left   TUBAL LIGATION  1975   Family History Family History  Problem Relation Age of Onset   Thyroid disease Mother    Dementia Father    Migraines Father    Colon cancer Neg Hx    Esophageal cancer Neg Hx    Stomach cancer Neg Hx    Rectal cancer Neg Hx     Social History Social History   Tobacco Use   Smoking status: Never    Passive exposure: Never   Smokeless tobacco: Never  Vaping Use   Vaping status: Never Used  Substance Use Topics   Alcohol use: No   Drug use: No   Allergies Patient has no known allergies.  Review of Systems Review of Systems  Constitutional:  Negative for activity change and fever.  HENT:  Negative for facial swelling and trouble swallowing.   Eyes:  Negative for discharge and redness.  Respiratory:  Negative for cough and shortness of breath.   Cardiovascular:  Negative for chest pain and palpitations.  Gastrointestinal:  Negative for abdominal pain and nausea.  Genitourinary:  Negative for dysuria and flank pain.  Musculoskeletal:  Negative for back pain and gait problem.  Skin:  Negative for pallor and rash.  Neurological:  Positive for headaches. Negative for syncope.       Paresthesia     Physical Exam Vital Signs  I have reviewed the triage vital signs BP (!) 120/58   Pulse 63   Temp 98.4 F (36.9 C) (Oral)   Resp 17   Ht 5\' 2"   (1.575 m)   Wt 59 kg   SpO2 96%   BMI 23.78 kg/m  Physical Exam Vitals and nursing note reviewed.  Constitutional:      General: She  is not in acute distress.    Appearance: Normal appearance.  HENT:     Head: Normocephalic and atraumatic.     Right Ear: External ear normal.     Left Ear: External ear normal.     Nose: Nose normal.     Mouth/Throat:     Mouth: Mucous membranes are moist.  Eyes:     General: No visual field deficit or scleral icterus.       Right eye: No discharge.        Left eye: No discharge.     Extraocular Movements: Extraocular movements intact.     Pupils: Pupils are equal, round, and reactive to light.  Cardiovascular:     Rate and Rhythm: Normal rate and regular rhythm.     Pulses: Normal pulses.     Heart sounds: Normal heart sounds.  Pulmonary:     Effort: Pulmonary effort is normal. No respiratory distress.     Breath sounds: Normal breath sounds. No stridor.  Abdominal:     General: Abdomen is flat. There is no distension.     Palpations: Abdomen is soft.     Tenderness: There is no abdominal tenderness.  Musculoskeletal:     Cervical back: No rigidity.     Right lower leg: No edema.     Left lower leg: No edema.  Skin:    General: Skin is warm and dry.     Capillary Refill: Capillary refill takes less than 2 seconds.  Neurological:     Mental Status: She is alert and oriented to person, place, and time.     Cranial Nerves: Cranial nerves 2-12 are intact. No dysarthria or facial asymmetry.     Sensory: Sensation is intact. No sensory deficit.     Motor: Motor function is intact. No weakness, tremor or pronator drift.     Coordination: Coordination is intact. Finger-Nose-Finger Test normal.     Comments: Gait testing deferred 2/2 pt safety  Strength 5/5 BLUE BLLE  No drift    Psychiatric:        Mood and Affect: Mood normal.        Behavior: Behavior normal. Behavior is cooperative.     ED Results and Treatments Labs (all labs  ordered are listed, but only abnormal results are displayed) Labs Reviewed  COMPREHENSIVE METABOLIC PANEL - Abnormal; Notable for the following components:      Result Value   Glucose, Bld 108 (*)    Calcium 8.5 (*)    Total Protein 5.9 (*)    Albumin 3.4 (*)    All other components within normal limits  CBC WITH DIFFERENTIAL/PLATELET  MAGNESIUM                                                                                                                          Radiology CT Head Wo Contrast  Result Date: 04/25/2023 CLINICAL DATA:  Transient armed tingling. Hemi the Marisue Ivan miss, resolved. EXAM: CT HEAD  WITHOUT CONTRAST TECHNIQUE: Contiguous axial images were obtained from the base of the skull through the vertex without intravenous contrast. RADIATION DOSE REDUCTION: This exam was performed according to the departmental dose-optimization program which includes automated exposure control, adjustment of the mA and/or kV according to patient size and/or use of iterative reconstruction technique. COMPARISON:  03/07/2023 FINDINGS: Brain: Intermediate to high density within 2 sulci of the superior right frontal lobe, the more posterior again showing faint calcification. The extent has increased from brain MRI and CTA. It hemorrhage, high-density is somewhat blunted for acute blood products would likely be subacute. No indication of complicated cerebral angiogram yesterday. No evidence of underlying swelling. No acute infarct, hydrocephalus, or shift. Vascular: No hyperdense vessel or unexpected calcification. Skull: Normal. Negative for fracture or focal lesion. Sinuses/Orbits: No acute finding. IMPRESSION: High-density material within 2 high right frontal sulci, increased extent from prior MR and CT imaging. There is either recurrent subarachnoid hemorrhage or alternate subarachnoid space pathology, recommend updated brain MRI with contrast. Electronically Signed   By: Tiburcio Pea M.D.   On: 04/25/2023  06:23    Pertinent labs & imaging results that were available during my care of the patient were reviewed by me and considered in my medical decision making (see MDM for details).  Medications Ordered in ED Medications - No data to display                                                                                                                                   Procedures Procedures  (including critical care time)  Medical Decision Making / ED Course    Medical Decision Making:    Sierra [name] is a 82 y.o. female with past medical history as below, significant for complicated migraine, SAH, CKD, HLD, HTN who presents to the ED with complaint of arm tingling/pain/headache. . The complaint involves an extensive differential diagnosis and also carries with it a high risk of complications and morbidity.  Serious etiology was considered. Ddx includes but is not limited to: Differential diagnosis includes but is not exclusive to subarachnoid hemorrhage, meningitis, encephalitis, previous head trauma, cavernous venous thrombosis, muscle tension headache, glaucoma, temporal arteritis, migraine or migraine equivalent, etc.   Complete initial physical exam performed, notably the patient  was neuro non-focal, she is asymptomatic .    Reviewed and confirmed nursing documentation for past medical history, family history, social history.  Vital signs reviewed.    Clinical Course as of 04/25/23 0745  Wed Apr 25, 2023  [id number] Spoke with neurology, agree w/ MRI w/wo, abnormality could be 2/2 contrast from arteriogram yesterday. Dr Derry Lory.  [SG]    Clinical Course User Index [SG] Sloan Leiter, DO   Non focal neuro exam, she is entirely asymptomatic CTH with pos SAH or other pathology D/w neuro as noted above MRI w/wo pending Arteriogram yesterday, stable Labs stable She is asymptomatic Signed out to incoming edp  pending MRI            Additional history  obtained: -Additional history obtained from family -External records from outside source obtained and reviewed including: Chart review including previous notes, labs, imaging, consultation notes including prior ed visits, prior labs/imaging    Lab Tests: -I ordered, reviewed, and interpreted labs.   The pertinent results include:   Labs Reviewed  COMPREHENSIVE METABOLIC PANEL - Abnormal; Notable for the following components:      Result Value   Glucose, Bld 108 (*)    Calcium 8.5 (*)    Total Protein 5.9 (*)    Albumin 3.4 (*)    All other components within normal limits  CBC WITH DIFFERENTIAL/PLATELET  MAGNESIUM    Notable for labs stable  EKG   EKG Interpretation Date/Time:    Ventricular Rate:    PR Interval:    QRS Duration:    QT Interval:    QTC Calculation:   R Axis:      Text Interpretation:           Imaging Studies ordered: I ordered imaging studies including CTH MRI w/wo I independently visualized the following imaging with scope of interpretation limited to determining acute life threatening conditions related to emergency care; findings noted above, significant for [facility] with concern for worsening SAH, MRI recommended  I independently visualized and interpreted imaging. I agree with the radiologist interpretation   Medicines ordered and prescription drug management: No orders of the defined types were placed in this encounter.   -I have reviewed the patients home medicines and have made adjustments as needed   Consultations Obtained: I requested consultation with the neurology,  and discussed lab and imaging findings as well as pertinent plan - they recommend: MRI w/wo   Cardiac Monitoring: The patient was maintained on a cardiac monitor.  I personally viewed and interpreted the cardiac monitored which showed an underlying rhythm of: NSR  Social Determinants of Health:  Diagnosis or treatment significantly limited by social determinants of health:  non smoker   Reevaluation: After the interventions noted above, I reevaluated the patient and found that they have resolved  Co morbidities that complicate the patient evaluation  Past Medical History:  Diagnosis Date   Cataract    Chronic kidney disease    deformed left kidney- removed at age 53   Common migraine 10/21/2015   Hyperlipidemia    Hypertension    Macular degeneration    Eyelea injections every 16 weeks   Osteoporosis    take Reclast yearly   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    Final Clinical Impression(s) / ED Diagnoses Final diagnoses:  None     This chart was dictated using voice recognition software.  Despite best efforts to proofread,  errors can occur which can change the documentation meaning.    Sloan Leiter, DO 04/25/23 0745

## 2023-04-25 NOTE — ED Notes (Signed)
 EEG at bedside.

## 2023-04-25 NOTE — Discharge Instructions (Signed)
Call Dr. Marlis Edelson office for recheck in 1-2 weeks. Return if any return or worsening of symptoms. Please take depakote as prescribed

## 2023-04-25 NOTE — Procedures (Signed)
Patient Name: Sierra [name]  MRN: [id number]  Epilepsy Attending: Charlsie Quest  Referring Physician/Provider: Elmer Picker, NP  Date: 04/25/2023 Duration: 24.40 mins  Patient history:  82 y.o. female with past medical history as below, significant for complicated migraine, SAH, CKD, HLD, HTN who presents to the ED with complaint of arm tingling/pain/headache. EEG to evaluate for seizure  Level of alertness: Awake, asleep  AEDs during EEG study: None  Technical aspects: This EEG study was done with scalp electrodes positioned according to the 10-20 International system of electrode placement. Electrical activity was reviewed with band pass filter of 1-70Hz , sensitivity of 7 uV/mm, display speed of 60mm/sec with a 60Hz  notched filter applied as appropriate. EEG data were recorded continuously and digitally stored.  Video monitoring was available and reviewed as appropriate.  Description: The posterior dominant rhythm consists of 8-9 Hz activity of moderate voltage (25-35 uV) seen predominantly in posterior head regions, symmetric and reactive to eye opening and eye closing. Sleep was characterized by vertex waves, sleep spindles (12 to 14 Hz), maximal frontocentral region. EEG showed intermittent generalized 3 to 5 Hz theta-delta slowing in left temporal region. Hyperventilation and photic stimulation were not performed.     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 No seizures or definite epileptiform discharges were seen throughout the recording.  Marlys Stegmaier Annabelle Harman

## 2023-04-25 NOTE — ED Notes (Signed)
Patient transported to CT 

## 2023-04-26 ENCOUNTER — Telehealth: Payer: Self-pay | Admitting: Neurology

## 2023-04-26 NOTE — Telephone Encounter (Signed)
FYI pt asked that Dr Lucia Gaskins be informed that on 7-16  Dr. Corliss Skains did her Arteriogram that turned out fine and that Dr Lucia Gaskins can review results on my chart, no call back requested.

## 2023-05-01 ENCOUNTER — Ambulatory Visit (INDEPENDENT_AMBULATORY_CARE_PROVIDER_SITE_OTHER): Payer: Federal, State, Local not specified - PPO | Admitting: Neurology

## 2023-05-01 ENCOUNTER — Encounter: Payer: Self-pay | Admitting: Neurology

## 2023-05-01 VITALS — BP 121/71 | HR 69 | Ht 60.0 in | Wt 128.0 lb

## 2023-05-01 DIAGNOSIS — G459 Transient cerebral ischemic attack, unspecified: Secondary | ICD-10-CM

## 2023-05-01 DIAGNOSIS — G43009 Migraine without aura, not intractable, without status migrainosus: Secondary | ICD-10-CM | POA: Diagnosis not present

## 2023-05-01 DIAGNOSIS — G3184 Mild cognitive impairment, so stated: Secondary | ICD-10-CM | POA: Diagnosis not present

## 2023-05-01 DIAGNOSIS — R569 Unspecified convulsions: Secondary | ICD-10-CM | POA: Diagnosis not present

## 2023-05-01 DIAGNOSIS — I680 Cerebral amyloid angiopathy: Secondary | ICD-10-CM

## 2023-05-01 MED ORDER — DIVALPROEX SODIUM ER 250 MG PO TB24: 250.0000 mg | ORAL_TABLET | Freq: Every day | ORAL | 3 refills | Status: AC

## 2023-05-01 NOTE — Progress Notes (Signed)
[facility] [address] [place]. [place] [address] [phone]       OFFICE FOLLOW-UP VISIT NOTE  Sierra [name] Date of Birth:  06/11/1941 Medical Record Number:  [id number]   Referring MD:  Dr Lucia Gaskins  Reason for Referral: Subarachnoid hemorrhage  HPI: Initial visit 02/07/2023 Sierra [name] is a 82 year old pleasant Caucasian lady seen today for office consultation visit for subarachnoid hemorrhage and TIA episodes.  She is accompanied by her daughter.  History is obtained from them and review of electronic medical records.  I personally reviewed pertinent available imaging films in PACS.  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  Fibromuscular dysplasia of carotid arteries.  Patient presented to [facility] emergency room on 01/18/2023 recurrent transient episodes of hand and face paresthesias in both sides.  He describes this as starting in the hands and then spreading to involve the cheeks right side more than left.  Episode lasted around 15 minutes.  She had 3 separate transient episodes which quite steotypical..  During 1 of these episodes her daughter noticed that there was right-sided facial droop and her speech was off with the thick.  She did not complain of significant headache but was treated with a migraine cocktail of Reglan and Benadryl.  CT scan of the head and subsequently MRI scan showed right frontal convexity sulcal subarachnoid hemorrhage.  Brain and echo images showed moderate subarachnoid hemorrhage in the left convexity as well as posteriorly of the right parietal occipital region as well.  Patient states she had 1 more episode after she went home that day did fine for several weeks until yesterday when she had similar episode which lasted only 5 minutes.  There are no obvious triggers for these episodes.  There is  no headache prior to during or after these episodes.  These episodes seem quite distant from her previous migraines and complicated migraine episodes.-In the past.  Patient is scheduled to undergo CT angiogram of the brain and neck which has not yet been done.  Patient denies any history of significant head injury with loss of consciousness, she does not take any blood thinners given aspirin.  She denies any memory difficulties, cognitive impairment.  She is living with her daughter but is quite independent in all activities of daily living.  She does at times forget appointments and daughter needs to remind her.  She denies any current migraine and the last migraine episode was 6 months ago.  She was seen previously in the office by Dr. Anne Hahn for migraines. Update 05/01/2023 : Patient returns for follow-up after last visit 2 and half months ago.  She was worked into the schedule upon request as she had recurrent episodes.  Patient was seen by me at last visit and had outpatient EEG done on 04/02/2023 which was normal.  CT angiogram of the brain and neck on 03/07/2023 showed no large vessel stenosis or occlusion or aneurysms however it showed beaded appearance of bilateral ICAs at the skull base suggestive of fibromuscular dysplasia hence she had diagnostic cerebral catheter angiogram performed on 04/24/2023 which however showed no evidence of aneurysms, dissections, AVM or any beaded appearance to suggest FMD.  Patient however postprocedure Was sleeping at her son's place so she can be monitored for the next 48hours. Overnight at around 0200, woke up with numbness/tingling in L arm and L arm was heavy and difficult  to coordinate. Felt like it was doing its own thing. This lasted 15 mins and then she went to sleep. She woke up an hour later and had the exact same episode. This lasted for 15 mins again and then went away. No headache, was not bearing down.  Later on the same day she had a second episode prompting visit  to the ER on 04/25/2023.  CT head showed hyperdense material in the right high frontal sulci and MRI with and without contrast which demonstrated small scattered strokes in bilateral hemispheres and right cerebellar hemisphere with some increase in subarachnoid hemorrhage in the right frontal hemorrhage.  Stable hemorrhage in the left temporal and bilateral parietal occipital region.  There is slight increase leptomeningeal enhancement.  Repeat EEG showed evidence of cortical dysfunction arising to the left temporal region which was nonspecific but no definite seizure activity.  I had advised the patient to start taking Depakote ER  250 mg daily.  She was advised to increase the dose to 500 mg daily but instead took both doses and total of 750 mg daily for the last 5 days and developed side effects and became cognitively slow and felt dizzy.  She stopped it yesterday.  She called the office and requested to be seen.  Patient denies significant headaches.  There is no focal extremity jerking noted though the patient did complain during the last 2 episodes like her left arm was moving.  She continues to be mostly independent in activities of daily living but has now moved in with the daughter and lives with her. 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   PONV (postoperative nausea and vomiting)    extreme has put her in the hospital   Vitamin B12 deficiency 04/27/2021    Social History:  Social History   Socioeconomic History   Marital status: Widowed     Spouse name: Not on file   Number of children: 2   Years of education: 57   Highest education level: Not on file  Occupational History   Occupation: Technical brewer    Comment: part time-8 hours a week  Tobacco Use   Smoking status: Never    Passive exposure: Never   Smokeless tobacco: Never  Vaping Use   Vaping status: Never Used  Substance and Sexual Activity   Alcohol use: No   Drug use: No   Sexual activity: Yes  Other Topics Concern   Not on file  Social History Narrative   Lives at home w/ her daughter and grandson   Patient drinks about 2 cups of caffeine daily.   Patient is left handed.    Social Determinants of Health   Financial Resource Strain: Not on file  Food Insecurity: Not on file  Transportation Needs: Not on file  Physical Activity: Not on file  Stress: Not on file  Social Connections: Not on file  Intimate Partner Violence: Not on file    Medications:   Current Outpatient Medications on File  Prior to Visit  Medication Sig Dispense Refill   acetaminophen (TYLENOL) 500 MG tablet Take 500 mg by mouth every 6 (six) hours as needed for mild pain or headache.     Calcium Carb-Cholecalciferol (CALCIUM + D3) 600-200 MG-UNIT TABS Take 1 tablet by mouth 2 (two) times daily.     Coenzyme Q10 (CO Q 10 PO) Take 200 mg by mouth daily.     divalproex (DEPAKOTE ER) 500 MG 24 hr tablet Take 1 tablet (500 mg total) by mouth daily. 30 tablet 0   docusate sodium (COLACE) 100 MG capsule Take 100 mg by mouth daily.     levothyroxine (SYNTHROID) 50 MCG tablet Take 50 mcg by mouth every morning.     Lysine 500 MG TABS Take 500 mg by mouth daily.     metoCLOPramide (REGLAN) 10 MG tablet Take 0.5-1 tablets (5-10 mg total) by mouth every 6 (six) hours as needed for nausea (nausea/headache). 10 tablet 0   Omega-3 Fatty Acids (FISH OIL) 1000 MG CAPS Take 1,000 mg by mouth daily.     polyvinyl alcohol (LIQUIFILM TEARS) 1.4 % ophthalmic solution Place 1 drop into both eyes 3 (three)  times daily. [employer] brand     propranolol ER (INDERAL LA) 120 MG 24 hr capsule TAKE 1 CAPSULE(120 MG) BY MOUTH DAILY 90 capsule 2   triamterene-hydrochlorothiazide (MAXZIDE-25) 37.5-25 MG tablet Take 1 tablet by mouth at bedtime.     TURMERIC PO Take 1 capsule by mouth daily.     vitamin B-12 (CYANOCOBALAMIN) 1000 MCG tablet Take 1,000 mcg by mouth daily.     No current facility-administered medications on file prior to visit.    Allergies:  No Known Allergies  Physical Exam General: well developed, well nourished pleasant elderly Caucasian lady, seated, in no evident distress Head: head normocephalic and atraumatic.   Neck: supple with no carotid or supraclavicular bruits Cardiovascular: regular rate and rhythm, no murmurs Musculoskeletal: no deformity Skin:  no rash/petichiae Vascular:  Normal pulses all extremities  Neurologic Exam Mental Status: Awake and fully alert. Oriented to place and time. Recent and remote memory intact. Attention span, concentration and fund of knowledge appropriate. Mood and affect appropriate.  Mini-Mental status exam score 25/30 ( last visit 29/30.)  Able to name 7 animals which can walk on 4 legs.  Clock drawing 4/4.Marland Kitchen  Depression scale  not done Cranial Nerves: Fundoscopic exam reveals sharp disc margins. Pupils equal, briskly reactive to light. Extraocular movements full without nystagmus. Visual fields full to confrontation. Hearing slightly decreased t. Facial sensation intact. Face, tongue, palate moves normally and symmetrically.  Motor: Normal bulk and tone. Normal strength in all tested extremity muscles. Sensory.: intact to touch , pinprick , position and vibratory sensation.  Coordination: Rapid alternating movements normal in all extremities. Finger-to-nose and heel-to-shin performed accurately bilaterally. Gait and Station: Arises from chair without difficulty. Stance is normal. Gait demonstrates normal stride length and balance . Able to heel,  toe and tandem walk with slight difficulty.  Reflexes: 1+ and symmetric. Toes downgoing.   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    Remote history of common migraine with 1 episode of complicated migraines in the past.  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  Recent episode of transient left upper extremity paresthesias triggered by diagnostic cerebral catheter angiogram with periprocedural small bilateral infarcts.  I recommend she stop Depakote 750 mg daily as she is not tolerating it for 2 days and then trial off Depakote ER 250 mg daily for 1 week and increase if tolerated to Depakote ER 500 mg daily for migraine and seizure prophylaxis.  Maintain strict control of hypertension with blood pressure goal below 130/90 and avoid any blood thinners.  Return for follow-up in 3 months with my nurse practitioner or call earlier if necessary.  I encouraged her to increase participation in cognitively challenging activities like solving crossword puzzles, playing bridge and  sudoku.  We also discussed memory compensation strategies.  Greater than 50% time during this 45-minute  visit was spent in counseling and coordination of care about recurrent episodes of TIAs , mild cohnitive impairment,and discussion about CT and MRI results and answering questions.  Delia Heady, MD Note: This document was prepared with digital dictation and possible smart phrase technology. Any transcriptional errors that result from this process are unintentional.

## 2023-05-01 NOTE — Patient Instructions (Addendum)
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  Recent episode of transient left upper extremity paresthesias triggered by diagnostic cerebral catheter angiogram with periprocedural small bilateral infarcts.  I recommend  Trial off Depakote ER 250 mg daily for 1 week and increase if tolerated to Depakote ER 500 mg daily for migraine and seizure prophylaxis.  Maintain strict control of hypertension with blood pressure goal below 130/90 and avoid any blood thinners.  Return for follow-up in 3 months with my nurse practitioner or call earlier if necessary.  I encouraged her to increase participation in cognitively challenging activities like solving crossword puzzles, playing bridge and sudoku.  We also discussed memory compensation strategies.   Memory Compensation Strategies  Use "WARM" strategy.  W= write it down  A= associate it  R= repeat it  M= make a mental note  2.   You can keep a Glass blower/designer.  Use a 3-ring notebook with sections for the following: calendar, important names and phone numbers,  medications, doctors' names/phone numbers, lists/reminders, and a section to journal what you did  each day.   3.    Use a calendar to write appointments down.  4.    Write yourself a schedule for the day.  This can be placed on the calendar or in a separate section of the Memory Notebook.  Keeping a  regular schedule can help memory.  5.    Use medication organizer with sections for each day or morning/evening pills.  You may need help loading it  6.    Keep a basket, or pegboard by the door.  Place items that you need to take out with you in the basket or on the pegboard.  You may also want to  include a message board for reminders.  7.    Use sticky  notes.  Place sticky notes with reminders in a place where the task is performed.  For example: " turn off the  stove" placed by the stove, "lock the door" placed on the door at eye level, " take your medications" on  the bathroom mirror or by the place where you normally take your medications.  8.    Use alarms/timers.  Use while cooking to remind yourself to check on food or as a reminder to take your medicine, or as a  reminder to make a call, or as a reminder to perform another task, etc.

## 2023-08-02 ENCOUNTER — Ambulatory Visit: Payer: Federal, State, Local not specified - PPO | Admitting: Neurology

## 2024-04-05 ENCOUNTER — Encounter (HOSPITAL_COMMUNITY): Payer: Self-pay | Admitting: Interventional Radiology
